# Patient Record
Sex: Female | Born: 1940 | Race: White | Hispanic: No | Smoking: Current every day smoker
Health system: Southern US, Community
[De-identification: ages and names within clinical notes are randomized; demographics above are authoritative.]

## PROBLEM LIST (undated history)

## (undated) DIAGNOSIS — M549 Dorsalgia, unspecified: Secondary | ICD-10-CM

## (undated) DIAGNOSIS — M542 Cervicalgia: Secondary | ICD-10-CM

## (undated) DIAGNOSIS — G8929 Other chronic pain: Secondary | ICD-10-CM

---

## 2017-03-03 ENCOUNTER — Emergency Department (HOSPITAL_BASED_OUTPATIENT_CLINIC_OR_DEPARTMENT_OTHER)
Admission: EM | Admit: 2017-03-03 | Discharge: 2017-03-03 | Disposition: A | Discharge status: Home / Self Care | Payer: Medicare Other | Attending: Emergency Medicine | Admitting: Emergency Medicine

## 2017-03-03 ENCOUNTER — Other Ambulatory Visit: Payer: Self-pay

## 2017-03-03 ENCOUNTER — Encounter (HOSPITAL_BASED_OUTPATIENT_CLINIC_OR_DEPARTMENT_OTHER): Payer: Self-pay | Admitting: Emergency Medicine

## 2017-03-03 DIAGNOSIS — F17200 Nicotine dependence, unspecified, uncomplicated: Secondary | ICD-10-CM | POA: Diagnosis not present

## 2017-03-03 DIAGNOSIS — M542 Cervicalgia: Secondary | ICD-10-CM | POA: Diagnosis present

## 2017-03-03 DIAGNOSIS — M5412 Radiculopathy, cervical region: Secondary | ICD-10-CM | POA: Diagnosis not present

## 2017-03-03 HISTORY — DX: Other chronic pain: G89.29

## 2017-03-03 HISTORY — DX: Dorsalgia, unspecified: M54.9

## 2017-03-03 HISTORY — DX: Cervicalgia: M54.2

## 2017-03-03 MED ORDER — ACETAMINOPHEN 500 MG PO TABS
1000.0000 mg | ORAL_TABLET | Freq: Once | ORAL | Status: AC
  Administered 2017-03-03: 1000 mg via ORAL
  Filled 2017-03-03: qty 2

## 2017-03-03 NOTE — ED Provider Notes (Signed)
[place] EMERGENCY DEPARTMENT Provider Note   CSN: [id number] Arrival date & time: 03/03/17  1431     History   Chief Complaint Chief Complaint  Patient presents with  . Neck Pain    HPI Alison Fisher is a 77 y.o. female.  77 year old female who presents with neck and shoulder pain.  The patient reports several weeks of neck and shoulder pain.  She was evaluated recently at another ER and had x-rays of her neck done.  She has been referred to a neurologist but her appointment is not until May.  She has an appointment with her PCP tomorrow.  She reports that her pain has been so severe recently that she has difficulty getting up out of bed in the mornings.  She has been taking aspirin with no relief.  She denies any recent falls.  She reports that the pain radiates down her shoulders and arms and is associated with some numbness in her right hand.  No symptoms in her legs.  No focal weakness.  No bowel/bladder problems.  No fevers.   The history is provided by the patient.  Neck Pain      Past Medical History:  Diagnosis Date  . Chronic neck and back pain     There are no active problems to display for this patient.   History reviewed. No pertinent surgical history.  OB History    No data available       Home Medications    Prior to Admission medications   Not on File    Family History History reviewed. No pertinent family history.  Social History Social History   Tobacco Use  . Smoking status: Current Every Day Smoker  . Smokeless tobacco: Never Used  Substance Use Topics  . Alcohol use: Yes    Alcohol/week: 10.2 oz    Types: 17 Shots of liquor per week    Frequency: Never    Comment: last drink 8 day ago - normally have 2 manhattans a day with [place] and [place]  . Drug use: No     Allergies   Penicillins and Prednisone   Review of Systems Review of Systems  Musculoskeletal: Positive for neck pain.   All other systems reviewed  and are negative except that which was mentioned in HPI   Physical Exam Updated Vital Signs BP (!) 182/96 (BP Location: Left Arm)   Pulse 80   Temp 98.2 F (36.8 C) (Oral)   Resp 18   Ht 4\' 10"  (1.473 m)   Wt 43.1 kg (95 lb)   SpO2 96%   BMI 19.86 kg/m   Physical Exam  Constitutional: She is oriented to person, place, and time. She appears well-developed and well-nourished. No distress.  Frail, elderly woman in NAD  HENT:  Head: Normocephalic and atraumatic.  Moist mucous membranes  Eyes: Conjunctivae are normal.  Neck: Neck supple.  Cardiovascular: Normal rate, regular rhythm and normal heart sounds.  No murmur heard. Pulmonary/Chest: Effort normal and breath sounds normal.  Abdominal: Soft. Bowel sounds are normal. She exhibits no distension. There is no tenderness.  Musculoskeletal: She exhibits no edema.  Kyphotic spine, no focal midline cervical or thoracic spine tenderness, generalized tenderness of bilateral trapezius muscles  Neurological: She is alert and oriented to person, place, and time. She displays normal reflexes. No sensory deficit.  Fluent speech 5/5 strength x all 4 ext Slow, steady gait  Skin: Skin is warm and dry.  Psychiatric: She has a normal mood  and affect.  Nursing note and vitals reviewed.    ED Treatments / Results  Labs (all labs ordered are listed, but only abnormal results are displayed) Labs Reviewed - No data to display  EKG  EKG Interpretation None       Radiology No results found.  Procedures Procedures (including critical care time)  Medications Ordered in ED Medications  acetaminophen (TYLENOL) tablet 1,000 mg (1,000 mg Oral Given 03/03/17 1720)     Initial Impression / Assessment and Plan / ED Course  I have reviewed the triage vital signs and the nursing notes.       Neurologically intact on exam.  Viewed plain films from other hospital which noted degenerative changes, no acute findings.  She describes  symptoms consistent with radiculopathy of the cervical spine.  She has no focal weakness today.  I have referred her to spine specialist as she may benefit from steroid injection and further evaluation with advanced imaging. Gave tylenol and discussed supportive measures. Final Clinical Impressions(s) / ED Diagnoses   Final diagnoses:  Cervical radiculopathy    ED Discharge Orders    None       Amalio Loe, Wenda Overland, MD 03/03/17 1726

## 2017-03-03 NOTE — ED Notes (Signed)
ED Provider at bedside. 

## 2017-03-03 NOTE — ED Notes (Signed)
amb to [facility] w/o assist

## 2017-03-03 NOTE — ED Notes (Signed)
[employer] voucher to provide pt a ride home

## 2017-03-03 NOTE — ED Triage Notes (Signed)
Per EMS:  Pt having neck pain for one month.  Pt seen by [facility] two weeks ago and referred to neurologist.  Pt states she is having a lot of pain and her appt is in May.  Has appt with PCP tomorrow.

## 2017-03-03 NOTE — ED Notes (Signed)
Pt demanding pain medication other than tylenol, MD instructed pt that al she was willing to given was tylenol

## 2017-03-15 ENCOUNTER — Emergency Department (HOSPITAL_COMMUNITY): Payer: Medicare Other

## 2017-03-15 ENCOUNTER — Inpatient Hospital Stay (HOSPITAL_COMMUNITY): Payer: Medicare Other

## 2017-03-15 ENCOUNTER — Inpatient Hospital Stay (HOSPITAL_COMMUNITY)
Admission: EM | Admit: 2017-03-15 | Discharge: 2017-04-06 | DRG: 471 | Disposition: E | Payer: Medicare Other | Attending: Pulmonary Disease | Admitting: Pulmonary Disease

## 2017-03-15 DIAGNOSIS — J439 Emphysema, unspecified: Secondary | ICD-10-CM | POA: Diagnosis present

## 2017-03-15 DIAGNOSIS — J690 Pneumonitis due to inhalation of food and vomit: Secondary | ICD-10-CM | POA: Diagnosis not present

## 2017-03-15 DIAGNOSIS — G92 Toxic encephalopathy: Secondary | ICD-10-CM | POA: Diagnosis present

## 2017-03-15 DIAGNOSIS — I9581 Postprocedural hypotension: Secondary | ICD-10-CM | POA: Diagnosis not present

## 2017-03-15 DIAGNOSIS — R627 Adult failure to thrive: Secondary | ICD-10-CM | POA: Diagnosis present

## 2017-03-15 DIAGNOSIS — Z880 Allergy status to penicillin: Secondary | ICD-10-CM

## 2017-03-15 DIAGNOSIS — Z7982 Long term (current) use of aspirin: Secondary | ICD-10-CM

## 2017-03-15 DIAGNOSIS — F17200 Nicotine dependence, unspecified, uncomplicated: Secondary | ICD-10-CM | POA: Diagnosis not present

## 2017-03-15 DIAGNOSIS — T424X5A Adverse effect of benzodiazepines, initial encounter: Secondary | ICD-10-CM | POA: Diagnosis present

## 2017-03-15 DIAGNOSIS — Z888 Allergy status to other drugs, medicaments and biological substances status: Secondary | ICD-10-CM

## 2017-03-15 DIAGNOSIS — Y92009 Unspecified place in unspecified non-institutional (private) residence as the place of occurrence of the external cause: Secondary | ICD-10-CM | POA: Diagnosis not present

## 2017-03-15 DIAGNOSIS — Z419 Encounter for procedure for purposes other than remedying health state, unspecified: Secondary | ICD-10-CM

## 2017-03-15 DIAGNOSIS — Z66 Do not resuscitate: Secondary | ICD-10-CM | POA: Diagnosis not present

## 2017-03-15 DIAGNOSIS — D72829 Elevated white blood cell count, unspecified: Secondary | ICD-10-CM | POA: Diagnosis not present

## 2017-03-15 DIAGNOSIS — R1313 Dysphagia, pharyngeal phase: Secondary | ICD-10-CM | POA: Diagnosis not present

## 2017-03-15 DIAGNOSIS — D649 Anemia, unspecified: Secondary | ICD-10-CM | POA: Diagnosis present

## 2017-03-15 DIAGNOSIS — J9601 Acute respiratory failure with hypoxia: Secondary | ICD-10-CM

## 2017-03-15 DIAGNOSIS — G8250 Quadriplegia, unspecified: Secondary | ICD-10-CM | POA: Diagnosis present

## 2017-03-15 DIAGNOSIS — Z515 Encounter for palliative care: Secondary | ICD-10-CM | POA: Diagnosis present

## 2017-03-15 DIAGNOSIS — C8330 Diffuse large B-cell lymphoma, unspecified site: Secondary | ICD-10-CM | POA: Diagnosis present

## 2017-03-15 DIAGNOSIS — J9690 Respiratory failure, unspecified, unspecified whether with hypoxia or hypercapnia: Secondary | ICD-10-CM

## 2017-03-15 DIAGNOSIS — G8929 Other chronic pain: Secondary | ICD-10-CM | POA: Diagnosis present

## 2017-03-15 DIAGNOSIS — G9340 Encephalopathy, unspecified: Secondary | ICD-10-CM | POA: Diagnosis present

## 2017-03-15 DIAGNOSIS — M4802 Spinal stenosis, cervical region: Secondary | ICD-10-CM | POA: Diagnosis present

## 2017-03-15 DIAGNOSIS — L8990 Pressure ulcer of unspecified site, unspecified stage: Secondary | ICD-10-CM

## 2017-03-15 DIAGNOSIS — Z4659 Encounter for fitting and adjustment of other gastrointestinal appliance and device: Secondary | ICD-10-CM

## 2017-03-15 DIAGNOSIS — R221 Localized swelling, mass and lump, neck: Secondary | ICD-10-CM | POA: Diagnosis not present

## 2017-03-15 DIAGNOSIS — M4852XA Collapsed vertebra, not elsewhere classified, cervical region, initial encounter for fracture: Secondary | ICD-10-CM | POA: Diagnosis not present

## 2017-03-15 DIAGNOSIS — R296 Repeated falls: Secondary | ICD-10-CM | POA: Diagnosis present

## 2017-03-15 DIAGNOSIS — Z720 Tobacco use: Secondary | ICD-10-CM | POA: Diagnosis not present

## 2017-03-15 DIAGNOSIS — W08XXXA Fall from other furniture, initial encounter: Secondary | ICD-10-CM | POA: Diagnosis not present

## 2017-03-15 DIAGNOSIS — G9520 Unspecified cord compression: Secondary | ICD-10-CM | POA: Diagnosis present

## 2017-03-15 LAB — COMPREHENSIVE METABOLIC PANEL
ALBUMIN: 3.7 g/dL (ref 3.5–5.0)
ALK PHOS: 69 U/L (ref 38–126)
ALT: 30 U/L (ref 14–54)
ANION GAP: 12 (ref 5–15)
AST: 30 U/L (ref 15–41)
BILIRUBIN TOTAL: 1.3 mg/dL — AB (ref 0.3–1.2)
BUN: 16 mg/dL (ref 6–20)
CO2: 30 mmol/L (ref 22–32)
Calcium: 9.1 mg/dL (ref 8.9–10.3)
Chloride: 92 mmol/L — ABNORMAL LOW (ref 101–111)
Creatinine, Ser: 0.43 mg/dL — ABNORMAL LOW (ref 0.44–1.00)
GFR calc Af Amer: >60 mL/min (ref >60)
GFR calc non Af Amer: >60 mL/min (ref >60)
GLUCOSE: 91 mg/dL (ref 65–99)
POTASSIUM: 3.9 mmol/L (ref 3.5–5.1)
SODIUM: 134 mmol/L — AB (ref 135–145)
TOTAL PROTEIN: 6.8 g/dL (ref 6.5–8.1)

## 2017-03-15 LAB — URINALYSIS, ROUTINE W REFLEX MICROSCOPIC
Bilirubin Urine: NEGATIVE
GLUCOSE, UA: NEGATIVE mg/dL
Hgb urine dipstick: NEGATIVE
Ketones, ur: 5 mg/dL — AB
LEUKOCYTES UA: NEGATIVE
Nitrite: NEGATIVE
PH: 7 (ref 5.0–8.0)
PROTEIN: NEGATIVE mg/dL
SPECIFIC GRAVITY, URINE: 1.013 (ref 1.005–1.030)

## 2017-03-15 LAB — I-STAT ARTERIAL BLOOD GAS, ED
Acid-Base Excess: 2 mmol/L (ref 0.0–2.0)
BICARBONATE: 27.9 mmol/L (ref 20.0–28.0)
O2 SAT: 98 %
PCO2 ART: 46.2 mmHg (ref 32.0–48.0)
PH ART: 7.387 (ref 7.350–7.450)
PO2 ART: 113 mmHg — AB (ref 83.0–108.0)
Patient temperature: 97.7
TCO2: 29 mmol/L (ref 22–32)

## 2017-03-15 LAB — I-STAT CHEM 8, ED
BUN: 19 mg/dL (ref 6–20)
CHLORIDE: 92 mmol/L — AB (ref 101–111)
CREATININE: 0.5 mg/dL (ref 0.44–1.00)
Calcium, Ion: 1.05 mmol/L — ABNORMAL LOW (ref 1.15–1.40)
Glucose, Bld: 90 mg/dL (ref 65–99)
HCT: 51 % — ABNORMAL HIGH (ref 36.0–46.0)
Hemoglobin: 17.3 g/dL — ABNORMAL HIGH (ref 12.0–15.0)
POTASSIUM: 4 mmol/L (ref 3.5–5.1)
Sodium: 134 mmol/L — ABNORMAL LOW (ref 135–145)
TCO2: 36 mmol/L — ABNORMAL HIGH (ref 22–32)

## 2017-03-15 LAB — CBC WITH DIFFERENTIAL/PLATELET
Basophils Absolute: 0 10*3/uL (ref 0.0–0.1)
Basophils Relative: 0 %
Eosinophils Absolute: 0.1 10*3/uL (ref 0.0–0.7)
Eosinophils Relative: 1 %
HEMATOCRIT: 48.4 % — AB (ref 36.0–46.0)
HEMOGLOBIN: 16.4 g/dL — AB (ref 12.0–15.0)
LYMPHS ABS: 1.1 10*3/uL (ref 0.7–4.0)
Lymphocytes Relative: 7 %
MCH: 34 pg (ref 26.0–34.0)
MCHC: 33.9 g/dL (ref 30.0–36.0)
MCV: 100.4 fL — AB (ref 78.0–100.0)
MONO ABS: 1.4 10*3/uL — AB (ref 0.1–1.0)
MONOS PCT: 9 %
NEUTROS PCT: 83 %
Neutro Abs: 13.1 10*3/uL — ABNORMAL HIGH (ref 1.7–7.7)
Platelets: 315 10*3/uL (ref 150–400)
RBC: 4.82 MIL/uL (ref 3.87–5.11)
RDW: 12.6 % (ref 11.5–15.5)
WBC: 15.7 10*3/uL — ABNORMAL HIGH (ref 4.0–10.5)

## 2017-03-15 LAB — PROTIME-INR
INR: 0.89
Prothrombin Time: 12 seconds (ref 11.4–15.2)

## 2017-03-15 LAB — TROPONIN I: Troponin I: <0.03 ng/mL (ref <0.03)

## 2017-03-15 LAB — CK: CK TOTAL: 222 U/L (ref 38–234)

## 2017-03-15 MED ORDER — SODIUM CHLORIDE 0.9 % IV BOLUS (SEPSIS)
1000.0000 mL | Freq: Once | INTRAVENOUS | Status: AC
  Administered 2017-03-15: 1000 mL via INTRAVENOUS

## 2017-03-15 MED ORDER — SODIUM CHLORIDE 0.9 % IV SOLN
INTRAVENOUS | Status: DC
  Administered 2017-03-15 – 2017-03-19 (×4): via INTRAVENOUS

## 2017-03-15 MED ORDER — DEXAMETHASONE SODIUM PHOSPHATE 10 MG/ML IJ SOLN
10.0000 mg | Freq: Once | INTRAMUSCULAR | Status: AC
  Administered 2017-03-15: 10 mg via INTRAVENOUS
  Filled 2017-03-15: qty 1

## 2017-03-15 MED ORDER — GADOBENATE DIMEGLUMINE 529 MG/ML IV SOLN
10.0000 mL | Freq: Once | INTRAVENOUS | Status: AC | PRN
  Administered 2017-03-15: 9 mL via INTRAVENOUS

## 2017-03-15 MED ORDER — ACETAMINOPHEN 325 MG PO TABS: 650.0000 mg | ORAL_TABLET | Freq: Four times a day (QID) | ORAL | Status: DC | PRN

## 2017-03-15 MED ORDER — ONDANSETRON HCL 4 MG PO TABS: 4.0000 mg | ORAL_TABLET | Freq: Four times a day (QID) | ORAL | Status: DC | PRN

## 2017-03-15 MED ORDER — ACETAMINOPHEN 650 MG RE SUPP: 650.0000 mg | Freq: Four times a day (QID) | RECTAL | Status: DC | PRN

## 2017-03-15 MED ORDER — ONDANSETRON HCL 4 MG/2ML IJ SOLN: 4.0000 mg | Freq: Four times a day (QID) | INTRAMUSCULAR | Status: DC | PRN

## 2017-03-15 MED ORDER — LORAZEPAM 2 MG/ML IJ SOLN
0.5000 mg | Freq: Once | INTRAMUSCULAR | Status: AC
  Administered 2017-03-15: 0.5 mg via INTRAVENOUS
  Filled 2017-03-15: qty 1

## 2017-03-15 MED ORDER — DEXAMETHASONE SODIUM PHOSPHATE 4 MG/ML IJ SOLN
4.0000 mg | Freq: Four times a day (QID) | INTRAMUSCULAR | Status: DC
  Administered 2017-03-16 – 2017-03-22 (×27): 4 mg via INTRAVENOUS
  Filled 2017-03-15 (×29): qty 1

## 2017-03-15 MED ORDER — IOPAMIDOL (ISOVUE-300) INJECTION 61%
INTRAVENOUS | Status: AC
  Administered 2017-03-15: 100 mL
  Filled 2017-03-15: qty 100

## 2017-03-15 NOTE — Consult Note (Addendum)
Chief Complaint   Chief Complaint  Patient presents with  . Weakness  . Fall    HPI   HPI: Alison Fisher is a 77 y.o. female who was brought to ER via EMS due to being unable to get up off floor after slipping from couch. When EMS arrived, she was unable to move any extremities voluntarily. She was given Ativan for MRI and therefore is still drowsy. She is unable to provide any history. Neighbor at bedside reports she has noticed a slow steady decline in mobility over the last several days with today being the most severe with the patient being unable to move any extremities. She also reports a slow decline in overall health status over the last several weeks as well. Patient has a sister in [place], but otherwise, no family. Neighbor is her closest friend in the area.  There are no active problems to display for this patient.   PMH: Past Medical History:  Diagnosis Date  . Chronic neck and back pain     PSH: No past surgical history on file.   (Not in a hospital admission)  SH: Social History   Tobacco Use  . Smoking status: Current Every Day Smoker  . Smokeless tobacco: Never Used  Substance Use Topics  . Alcohol use: Yes    Alcohol/week: 10.2 oz    Types: 17 Shots of liquor per week    Frequency: Never    Comment: last drink 8 day ago - normally have 2 manhattans a day with [place] and [place]  . Drug use: No   MEDS: Prior to Admission medications   Not on File    ALLERGY: Allergies  Allergen Reactions  . Penicillins Hives  . Prednisone Swelling    Social History   Tobacco Use  . Smoking status: Current Every Day Smoker  . Smokeless tobacco: Never Used  Substance Use Topics  . Alcohol use: Yes    Alcohol/week: 10.2 oz    Types: 17 Shots of liquor per week    Frequency: Never    Comment: last drink 8 day ago - normally have 2 manhattans a day with [place] and [place]     No family history on file.   ROS   ROS unable to obtain secondary to mental  status   Exam   Vitals:   03/09/2017 1545 03/22/2017 1900  BP: 133/63 130/64  Pulse: (!) 59 73  Resp: 19 17  Temp: 97.7 F (36.5 C)   SpO2: 94% 92%   Elderly female resting comfortably in bed Drowsy from recent ativan Does answer questions with yes and no Does not follow commands Does not voluntarily move extremities Minimal flexion of BLE to painful stimulus + Babinski b/l + Hoffmans, L>R Unable to check gait CN grossly normal   Results - Imaging/Labs   Results for orders placed or performed during the hospital encounter of 03/22/2017 (from the past 48 hour(s))  Comprehensive metabolic panel     Status: Abnormal   Collection Time: 03/25/2017  4:30 PM  Result Value Ref Range   Sodium 134 (L) 135 - 145 mmol/L   Potassium 3.9 3.5 - 5.1 mmol/L   Chloride 92 (L) 101 - 111 mmol/L   CO2 30 22 - 32 mmol/L   Glucose, Bld 91 65 - 99 mg/dL   BUN 16 6 - 20 mg/dL   Creatinine, Ser 0.43 (L) 0.44 - 1.00 mg/dL   Calcium 9.1 8.9 - 10.3 mg/dL   Total Protein 6.8 6.5 -  8.1 g/dL   Albumin 3.7 3.5 - 5.0 g/dL   AST 30 15 - 41 U/L   ALT 30 14 - 54 U/L   Alkaline Phosphatase 69 38 - 126 U/L   Total Bilirubin 1.3 (H) 0.3 - 1.2 mg/dL   GFR calc non Af Amer >60 >60 mL/min   GFR calc Af Amer >60 >60 mL/min    Comment: (NOTE) The eGFR has been calculated using the CKD EPI equation. This calculation has not been validated in all clinical situations. eGFR's persistently <60 mL/min signify possible Chronic Kidney Disease.    Anion gap 12 5 - 15    Comment: Performed at [facility], [place] [address], [place], [place] [address]  Troponin I     Status: None   Collection Time: 03/28/2017  4:30 PM  Result Value Ref Range   Troponin I <0.03 <0.03 ng/mL    Comment: Performed at [facility], [place] [address], [place], [place] [address]  CBC with Differential     Status: Abnormal   Collection Time: 03/27/2017  4:30 PM  Result Value Ref Range   WBC 15.7 (H) 4.0 - 10.5  K/uL   RBC 4.82 3.87 - 5.11 MIL/uL   Hemoglobin 16.4 (H) 12.0 - 15.0 g/dL   HCT 48.4 (H) 36.0 - 46.0 %   MCV 100.4 (H) 78.0 - 100.0 fL   MCH 34.0 26.0 - 34.0 pg   MCHC 33.9 30.0 - 36.0 g/dL   RDW 12.6 11.5 - 15.5 %   Platelets 315 150 - 400 K/uL   Neutrophils Relative % 83 %   Neutro Abs 13.1 (H) 1.7 - 7.7 K/uL   Lymphocytes Relative 7 %   Lymphs Abs 1.1 0.7 - 4.0 K/uL   Monocytes Relative 9 %   Monocytes Absolute 1.4 (H) 0.1 - 1.0 K/uL   Eosinophils Relative 1 %   Eosinophils Absolute 0.1 0.0 - 0.7 K/uL   Basophils Relative 0 %   Basophils Absolute 0.0 0.0 - 0.1 K/uL    Comment: Performed at [facility], [place] [address], [place], [place] [address]  Protime-INR     Status: None   Collection Time: 04/01/2017  4:30 PM  Result Value Ref Range   Prothrombin Time 12.0 11.4 - 15.2 seconds   INR 0.89     Comment: Performed at [facility], [place] [address], [place], [place] [address]  CK     Status: None   Collection Time: 03/12/2017  4:30 PM  Result Value Ref Range   Total CK 222 38 - 234 U/L    Comment: Performed at [facility], [place] [address], [place], [place] [address]  I-stat Chem 8, ED     Status: Abnormal   Collection Time: 03/26/2017  4:35 PM  Result Value Ref Range   Sodium 134 (L) 135 - 145 mmol/L   Potassium 4.0 3.5 - 5.1 mmol/L   Chloride 92 (L) 101 - 111 mmol/L   BUN 19 6 - 20 mg/dL   Creatinine, Ser 0.50 0.44 - 1.00 mg/dL   Glucose, Bld 90 65 - 99 mg/dL   Calcium, Ion 1.05 (L) 1.15 - 1.40 mmol/L   TCO2 36 (H) 22 - 32 mmol/L   Hemoglobin 17.3 (H) 12.0 - 15.0 g/dL   HCT 51.0 (H) 36.0 - 46.0 %  Urinalysis, Routine w reflex microscopic     Status: Abnormal   Collection Time: 03/14/2017  5:13 PM  Result Value  Ref Range   Color, Urine YELLOW YELLOW   APPearance HAZY (A) CLEAR   Specific Gravity, Urine 1.013 1.005 - 1.030   pH 7.0 5.0 - 8.0   Glucose, UA NEGATIVE NEGATIVE mg/dL   Hgb urine dipstick NEGATIVE NEGATIVE    Bilirubin Urine NEGATIVE NEGATIVE   Ketones, ur 5 (A) NEGATIVE mg/dL   Protein, ur NEGATIVE NEGATIVE mg/dL   Nitrite NEGATIVE NEGATIVE   Leukocytes, UA NEGATIVE NEGATIVE    Comment: Performed at [place] [address] [place], [place] [address]    Mr Cervical Spine W Wo Contrast  Result Date: 03/27/2017 CLINICAL DATA:  Generalized weakness. EXAM: MRI CERVICAL SPINE WITHOUT AND WITH CONTRAST TECHNIQUE: Multiplanar and multiecho pulse sequences of the cervical spine, to include the craniocervical junction and cervicothoracic junction, were obtained without and with intravenous contrast. CONTRAST:  29m MULTIHANCE GADOBENATE DIMEGLUMINE 529 MG/ML IV SOLN COMPARISON:  None. FINDINGS: Alignment: No listhesis. Vertebrae: Infiltrative enhancing mass throughout the C5 and C6 bodies, also invading the anterior C4 body. The mass is primarily enhancing, but there is a large ventral cystic component bulging into the prevertebral space where there is also ill-defined fluid signal. This cystic component measures up to 4.2 cm craniocaudal. The C5 body is partially nonenhancing and has collapsed with ventral epidural mass and bone retropulsion impinging on the cord which is mildly edematous. Both C4-5 and C5-6 foramina are likely infiltrated by mass. This has the appearance of malignancy rather than infection. Cord: Cord compression at C5, with T2 hyperintensity. Posterior Fossa, vertebral arteries, paraspinal tissues: Prevertebral mass as above. There is paraspinal edema symmetrically in the midcervical region, presumably related to patient's recent fall. Disc levels: C2-3: Facet spurring and ligamentum flavum buckling.  No impingement C3-4: Facet spurring and small central disc protrusion. Patent canal and foramina. C4-5: Cord compression by mass and C5 pathologic fracture retropulsion. C5-6: Cord compression by mass and C5 pathologic fracture retropulsion. C6-7: Unremarkable for age.  No  impingement. C7-T1:Unremarkable for age.  No impingement. Dr. GRegenia Skeeteris already aware of the findings at time of dictation. IMPRESSION: 1. 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 Large cystic/necrotic component which bulges into the prevertebral space. 2. Paravertebral cervical strain. CT would be complementary in excluding a posterior element fracture. Electronically Signed   By: JMonte FantasiaM.D.   On: 03/08/2017 18:59    Impression/Plan   77y.o. 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 There is T2 signal abnormality. She is quadriparetic. I have reviewed the case with attending, Dr NConsuella Losewho has also reviewed the imaging.  - No history of CA. Will need metastatic work up  - Because of significant cord compression, she will need to undergo decompressive cervical surgery. Likely cervical corpectomy with anterior plating and posterior fusion. Overall, slow decline in weakness >72 hours old. Will proceed tomorrow.  - Decadron 172monce and then 52m42m 6 hours. - CT C spine ordered for bone quality.

## 2017-03-15 NOTE — ED Provider Notes (Signed)
  Physical Exam  BP 130/64   Pulse 73   Temp 97.7 F (36.5 C) (Oral)   Resp 17   SpO2 92%   Physical Exam  ED Course/Procedures   Clinical Course as of Mar 15 2009  Sun Mar 15, 2017  1624 MRI is on-call for our facility and will currently be called in for a stat cervical MRI.  She would be given a dose of Ativan to help with anxiety for her claustrophobia.  [SG]    Clinical Course User Index [SG] Sherwood Gambler, MD    Procedures  MDM  Patient with cervical spine compression.  Sent from [place] to neurosurgery.  Discussed with neurosurgery and they are seeing the patient did not need further involvement from [place] in the ER.     Davonna Belling, MD 03/10/2017 2011

## 2017-03-15 NOTE — ED Notes (Signed)
This RN unable to remove garment from being too tight. Pt authorized this RN to cut pt top garment to remove it.

## 2017-03-15 NOTE — ED Notes (Signed)
Patient transported to MRI with MRI techs.

## 2017-03-15 NOTE — ED Triage Notes (Addendum)
Transported by [facility] from home--pt reports generalized weakness and multiple recent falls. Pt experienced a fall last night and friend found her today in the floor, friend called EMS. Pt reports neck pain + weakness. A & O x 4. VS: 100/60, 60 HR, 16 RR, CBG 106 mg/dl.

## 2017-03-15 NOTE — Progress Notes (Signed)
Received call fom EDP, Dr Regenia Skeeter regarding patient Presented to ER due to being unable to get up off floor after slipping out of couch. She is by report quadriparetic. MRI C spine is significant for infiltrative mass in C4 C5 and C6 vertebra, C5 pathological fracture with retropulsion causing spinal stenosis. There is cord compression and T2 signal abnormality. She has no known primary cancer. Rec hospitalist admission for work up. Patient to be transported ED to ED for stat NSY consultation

## 2017-03-15 NOTE — ED Notes (Signed)
Pt O2 found to be 92% on room air. Pt placed on 2L [place]. Pt O2 now 98%. Will continue to monitor.

## 2017-03-15 NOTE — ED Notes (Signed)
Patient transported to CT 

## 2017-03-15 NOTE — H&P (Signed)
History and Physical    Alison Fisher NWG:[id number] DOB: 06-25-1940 DOA: 03/29/2017  PCP: Patient, No Pcp Per  Patient coming from: Home  I have personally briefly reviewed patient's old medical records in [place]  Chief Complaint: Weakness, fall  HPI: Alison Fisher is a 77 y.o. female with medical history significant of chronic neck and back pain.  Patient was brought to ER via EMS due to being unable to get up off floor after slipping from couch.  When EMS arrived, she was unable to move any extremities voluntarily.  From other provider notes it seems as though she has had progressively worsening BUE and BLE weakness over the past few days.  Now complete plegia after fall off couch.  She was given Ativan for MRI and therefore is still drowsy. She is unable to provide any history to me right now.   ED Course: MRI reveals pathologic fracture of C spine, c spine mass, cord impingement.  Neurosurgery has already seen patient, see their note for details.   Review of Systems: Unable to perform due to patient AMS.  Past Medical History:  Diagnosis Date  . Chronic neck and back pain     No past surgical history on file.   reports that she has been smoking.  she has never used smokeless tobacco. She reports that she drinks about 10.2 oz of alcohol per week. She reports that she does not use drugs.  Allergies  Allergen Reactions  . Penicillins Hives    No other information available 03/29/2017 Has patient had a PCN reaction causing immediate rash, facial/tongue/throat swelling, SOB or lightheadedness with hypotension: Unknown Has patient had a PCN reaction causing severe rash involving mucus membranes or skin necrosis: Unknown Has patient had a PCN reaction that required hospitalization: Unknown Has patient had a PCN reaction occurring within the last 10 years: Unknown If all of the above answers are "NO", then may proceed with Cephalosporin use.  . Prednisone Swelling    No  family history on file. Unable to obtain due to AMS  Prior to Admission medications   Medication Sig Start Date End Date Taking? Authorizing Provider  aspirin EC 81 MG tablet Take 81 mg by mouth daily.    [provider]  methocarbamol (ROBAXIN) 500 MG tablet Take 500 mg by mouth 4 (four) times daily. 10 day course filled 03/04/17 03/04/17   [provider]  methylPREDNISolone (MEDROL DOSEPAK) 4 MG TBPK tablet Take 4 mg by mouth. 6 day dose pak filled 03/04/17 03/04/17   [provider]  predniSONE (DELTASONE) 10 MG tablet Take 20 mg by mouth daily. 7 day course filled 03/10/17 03/10/17   [provider]  traMADol (ULTRAM) 50 MG tablet Take 50 mg by mouth every 8 (eight) hours as needed (pain).  03/09/17   [provider]    Physical Exam: Vitals:   03/06/2017 1545 03/09/2017 1900 03/14/2017 2122  BP: 133/63 130/64 139/60  Pulse: (!) 59 73 69  Resp: 19 17 13   Temp: 97.7 F (36.5 C)    TempSrc: Oral    SpO2: 94% 92% 99%    Constitutional: Resting in bed, difficult to arrouse but will open eyes and answer a few questions in soft voice. Eyes: PERRL, lids and conjunctivae normal ENMT: Mucous membranes are moist. Posterior pharynx clear of any exudate or lesions.Normal dentition.  Neck: normal, no masses, no thyromegaly movement not tested due to fx Respiratory: clear to auscultation bilaterally, no wheezing, no crackles. Normal respiratory  effort. No accessory muscle use.  Cardiovascular: Regular rate and rhythm, no murmurs / rubs / gallops. No extremity edema. 2+ pedal pulses. No carotid bruits.  Abdomen: no tenderness, no masses palpated. No hepatosplenomegaly. Bowel sounds positive.  Musculoskeletal: no clubbing / cyanosis. No joint deformity upper and lower extremities. Good ROM, no contractures. Normal muscle tone.  Skin: no rashes, lesions, ulcers. No induration Neurologic: Slowly answering few questions yes and no.  +babinski bilaterally, + hoffman's  L> R, quadriplegic Psychiatric: Sedated.   Labs on Admission: I have personally reviewed following labs and imaging studies  CBC: Recent Labs  Lab 03/21/2017 1630 04/01/2017 1635  WBC 15.7*  --   NEUTROABS 13.1*  --   HGB 16.4* 17.3*  HCT 48.4* 51.0*  MCV 100.4*  --   PLT 315  --    Basic Metabolic Panel: Recent Labs  Lab 03/30/2017 1630 03/12/2017 1635  NA 134* 134*  K 3.9 4.0  CL 92* 92*  CO2 30  --   GLUCOSE 91 90  BUN 16 19  CREATININE 0.43* 0.50  CALCIUM 9.1  --    GFR: Estimated Creatinine Clearance: 37.4 mL/min (by C-G formula based on SCr of 0.5 mg/dL). Liver Function Tests: Recent Labs  Lab 03/10/2017 1630  AST 30  ALT 30  ALKPHOS 69  BILITOT 1.3*  PROT 6.8  ALBUMIN 3.7   No results for input(s): LIPASE, AMYLASE in the last 168 hours. No results for input(s): AMMONIA in the last 168 hours. Coagulation Profile: Recent Labs  Lab 03/07/2017 1630  INR 0.89   Cardiac Enzymes: Recent Labs  Lab 04/05/2017 1630  CKTOTAL 222  TROPONINI <0.03   BNP (last 3 results) No results for input(s): PROBNP in the last 8760 hours. HbA1C: No results for input(s): HGBA1C in the last 72 hours. CBG: No results for input(s): GLUCAP in the last 168 hours. Lipid Profile: No results for input(s): CHOL, HDL, LDLCALC, TRIG, CHOLHDL, LDLDIRECT in the last 72 hours. Thyroid Function Tests: No results for input(s): TSH, T4TOTAL, FREET4, T3FREE, THYROIDAB in the last 72 hours. Anemia Panel: No results for input(s): VITAMINB12, FOLATE, FERRITIN, TIBC, IRON, RETICCTPCT in the last 72 hours. Urine analysis:    Component Value Date/Time   COLORURINE YELLOW 03/09/2017 1713   APPEARANCEUR HAZY (A) 03/12/2017 1713   LABSPEC 1.013 03/31/2017 1713   PHURINE 7.0 03/27/2017 1713   GLUCOSEU NEGATIVE 03/06/2017 1713   HGBUR NEGATIVE 04/05/2017 1713   BILIRUBINUR NEGATIVE 03/25/2017 1713   KETONESUR 5 (A) 03/09/2017 1713   PROTEINUR NEGATIVE 03/14/2017 1713   NITRITE NEGATIVE 03/14/2017  1713   LEUKOCYTESUR NEGATIVE 03/07/2017 1713    Radiological Exams on Admission: Ct Cervical Spine Wo Contrast  Result Date: 03/22/2017 CLINICAL DATA:  Cervical fracture. EXAM: CT CERVICAL SPINE WITHOUT CONTRAST TECHNIQUE: Multidetector CT imaging of the cervical spine was performed without intravenous contrast. Multiplanar CT image reconstructions were also generated. COMPARISON:  MRI of the cervical spine, 03/17/2017 FINDINGS: Alignment: Mild kyphosis centered at C5-C6.  No spondylolisthesis. Skull base and vertebrae: There is a lytic destructive lesion extending throughout most of the C5 and C6 vertebral bodies. There is marked loss of disc height at this level. Small lucencies are noted in the C7 vertebra. The lytic destructive lesion at C5-C6 has a soft tissue component that bulges into the prevertebral soft tissues elevating the overlying pharynx and larynx. The soft tissue mass including the bone lesion component, measures approximately 5.2 x 2.9 x 3.4 cm. There is moderate loss of height  of the C5 vertebra with mild loss of height of the C6 vertebra, along with the loss of disc height. Soft tissues and spinal canal: There is posterior bulging of the C5 vertebra encroaching upon the spinal canal, better evaluated on the current MRI. Disc levels: Moderate loss of disc height is noted at C6-C7, which appears degenerative. Upper chest: No other soft tissue masses. No discrete enlarged lymph nodes. Lung apices are clear. Other: None. IMPRESSION: 1. Pathologic fracture of C5 due to a large infiltrative mass that involves C5 and C6, extends along the prevertebral soft tissues and encroaches upon the ventral epidural space, better assessed on the current cervical MRI. This may reflect metastatic disease or a primary bone neoplasm such as lymphoma. Electronically Signed   By: Lajean Manes M.D.   On: 03/28/2017 21:33   Mr Cervical Spine W Wo Contrast  Result Date: 03/06/2017 CLINICAL DATA:  Generalized  weakness. EXAM: MRI CERVICAL SPINE WITHOUT AND WITH CONTRAST TECHNIQUE: Multiplanar and multiecho pulse sequences of the cervical spine, to include the craniocervical junction and cervicothoracic junction, were obtained without and with intravenous contrast. CONTRAST:  61mL MULTIHANCE GADOBENATE DIMEGLUMINE 529 MG/ML IV SOLN COMPARISON:  None. FINDINGS: Alignment: No listhesis. Vertebrae: Infiltrative enhancing mass throughout the C5 and C6 bodies, also invading the anterior C4 body. The mass is primarily enhancing, but there is a large ventral cystic component bulging into the prevertebral space where there is also ill-defined fluid signal. This cystic component measures up to 4.2 cm craniocaudal. The C5 body is partially nonenhancing and has collapsed with ventral epidural mass and bone retropulsion impinging on the cord which is mildly edematous. Both C4-5 and C5-6 foramina are likely infiltrated by mass. This has the appearance of malignancy rather than infection. Cord: Cord compression at C5, with T2 hyperintensity. Posterior Fossa, vertebral arteries, paraspinal tissues: Prevertebral mass as above. There is paraspinal edema symmetrically in the midcervical region, presumably related to patient's recent fall. Disc levels: C2-3: Facet spurring and ligamentum flavum buckling.  No impingement C3-4: Facet spurring and small central disc protrusion. Patent canal and foramina. C4-5: Cord compression by mass and C5 pathologic fracture retropulsion. C5-6: Cord compression by mass and C5 pathologic fracture retropulsion. C6-7: Unremarkable for age.  No impingement. C7-T1:Unremarkable for age.  No impingement. Dr. Regenia Skeeter is already aware of the findings at time of dictation. IMPRESSION: 1. 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 Ventral epidural mass and C5 pathologic fracture with retropulsion causes spinal stenosis with cord compression and T2 signal  abnormality. Large cystic/necrotic component which bulges into the prevertebral space. 2. Paravertebral cervical strain. CT would be complementary in excluding a posterior element fracture. Electronically Signed   By: Monte Fantasia M.D.   On: 03/18/2017 18:59    EKG: Independently reviewed.  Assessment/Plan Principal Problem:   Pathologic compression fracture of cervical vertebra, initial encounter [facility]) Active Problems:   Acute encephalopathy   Quadriplegia and quadriparesis (HCC)   Neck mass    1. Pathologic fx of cervical vertebras due to neck mass, with associated C spine compression and quadriplegia / quadriparesis - 1. See NS consult note 2. Surgery tomorrow 3. Per NS: Decadron IV - h/o prednisone "allergy" in computer, however, patient filled a methylprednisolone script 03/04/17 according to pharmacy records 4. Per NS: no need for C.Colar (I specifically asked), they dont feel this will help. 5. Per NS: no specific head of bed positioning required at this time (also specifically asked).  6. NPO for surgery 7. Obtaining CT of chest/abd/pelvis to go looking for other mets / primary tumor site. 2. Acute encephalopathy - 1. likely iatrogenic from the ativan she was given earlier 2. Observe and let ativan wear off 3. ABG obtained, looks good, no evidence of CO2 retention or indication for intubation at this time. 4. Will put on SDU for close monitoring overnight  DVT prophylaxis: SCDs only - planned OR in AM Code Status: Full Family Communication: No family in room Disposition Plan: TBD Consults called: Neurosurgery Admission status: Admit to inpatient - inpatient status for new quadriplegia, catastrophic C spine injury.   Etta Quill DO [facility] Pager [phone]  If 7AM-7PM, please contact day team taking care of patient [email or web address] Password [email or web address]  03/14/2017, 10:30 PM

## 2017-03-15 NOTE — ED Notes (Signed)
Bed: WA21 Expected date:  Expected time:  Means of arrival:  Comments: Weakness 

## 2017-03-15 NOTE — ED Notes (Addendum)
Pt here for neursurgery consult, mass on t4-t6. Pt had ativan prior to being transferred and is groggy per [facility].

## 2017-03-15 NOTE — ED Provider Notes (Signed)
[facility] DEPT Provider Note   CSN: [id number] Arrival date & time: 03/29/2017  1528     History   Chief Complaint Chief Complaint  Patient presents with  . Weakness  . Fall    HPI Alison Fisher is a 77 y.o. female.  HPI  77 year old female presents with weakness.  She is an overall poor historian.  She states she was on a couch last night and slipped off and was unable to get up.  She laid there until her neighbor came to check on her but her neighbor was also unable to get her up.  EMS had to be called in and was able to get her up and bring her here.  She states that all 4 extremities are weak.  She has noticed some arm and hand weakness and numbness but now it is progressive.  She is unable to tell me exactly how long is been going on.  All of her neck pain and weakness symptoms started in February.  However she was seen here February 26 and was able to move all 4 extremities.  She was put on steroids as well and states she has finished this.  She states sometime between then and now she developed a weakness that has been gradually worsening.  She states she has been having urinary incontinence on and off for about a month.  She denies any neck trauma and did not injure her neck on the fall last night.  Has had some headaches on and off but no significant headache.  Past Medical History:  Diagnosis Date  . Chronic neck and back pain     There are no active problems to display for this patient.   No past surgical history on file.  OB History    No data available       Home Medications    Prior to Admission medications   Not on File    Family History No family history on file.  Social History Social History   Tobacco Use  . Smoking status: Current Every Day Smoker  . Smokeless tobacco: Never Used  Substance Use Topics  . Alcohol use: Yes    Alcohol/week: 10.2 oz    Types: 17 Shots of liquor per week    Frequency: Never   Comment: last drink 8 day ago - normally have 2 manhattans a day with [place] and [place]  . Drug use: No     Allergies   Penicillins and Prednisone   Review of Systems Review of Systems  Constitutional: Negative for fever.  Cardiovascular: Negative for chest pain.  Gastrointestinal: Negative for abdominal pain.  Musculoskeletal: Positive for neck pain.  Neurological: Positive for weakness and numbness.     Physical Exam Updated Vital Signs BP 130/64   Pulse 73   Temp 97.7 F (36.5 C) (Oral)   Resp 17   SpO2 92%   Physical Exam  Constitutional: She is oriented to person, place, and time. She appears well-developed and well-nourished.  HENT:  Head: Normocephalic and atraumatic.  Right Ear: External ear normal.  Left Ear: External ear normal.  Nose: Nose normal.  Mouth/Throat: Mucous membranes are dry.  Eyes: EOM are normal. Pupils are equal, round, and reactive to light. Right eye exhibits no discharge. Left eye exhibits no discharge.  Neck: No spinous process tenderness present.  Cardiovascular: Normal rate, regular rhythm, normal heart sounds and intact distal pulses.  Pulmonary/Chest: Effort normal and breath sounds normal.  Abdominal: Soft.  There is no tenderness.  Musculoskeletal:       Cervical back: She exhibits tenderness.       Back:  Neurological: She is alert and oriented to person, place, and time. She displays Babinski's sign on the right side. She displays Babinski's sign on the left side.  Reflex Scores:      Patellar reflexes are 1+ on the right side and 0 on the left side.      Achilles reflexes are 1+ on the right side and 1+ on the left side. Unable to move all 4 extremities. CN 3-12 grossly intact. Normal speech. Normal sensation RUE. Decreased sensation from left forearm to hand. Decreased sensation in lower legs/feet  Skin: Skin is warm and dry.  Nursing note and vitals reviewed.    ED Treatments / Results  Labs (all labs ordered are  listed, but only abnormal results are displayed) Labs Reviewed  COMPREHENSIVE METABOLIC PANEL - Abnormal; Notable for the following components:      Result Value   Sodium 134 (*)    Chloride 92 (*)    Creatinine, Ser 0.43 (*)    Total Bilirubin 1.3 (*)    All other components within normal limits  CBC WITH DIFFERENTIAL/PLATELET - Abnormal; Notable for the following components:   WBC 15.7 (*)    Hemoglobin 16.4 (*)    HCT 48.4 (*)    MCV 100.4 (*)    Neutro Abs 13.1 (*)    Monocytes Absolute 1.4 (*)    All other components within normal limits  URINALYSIS, ROUTINE W REFLEX MICROSCOPIC - Abnormal; Notable for the following components:   APPearance HAZY (*)    Ketones, ur 5 (*)    All other components within normal limits  I-STAT CHEM 8, ED - Abnormal; Notable for the following components:   Sodium 134 (*)    Chloride 92 (*)    Calcium, Ion 1.05 (*)    TCO2 36 (*)    Hemoglobin 17.3 (*)    HCT 51.0 (*)    All other components within normal limits  TROPONIN I  PROTIME-INR  CK    EKG  EKG Interpretation  Date/Time:  Sunday March 15 2017 16:53:56 EDT Ventricular Rate:  61 PR Interval:    QRS Duration: 87 QT Interval:  441 QTC Calculation: 445 R Axis:   81 Text Interpretation:  Sinus rhythm Short PR interval Borderline right axis deviation Consider left ventricular hypertrophy Nonspecific T abnormalities, lateral leads Baseline wander in lead(s) V3 No old tracing to compare limited eval due to wander of V3 Confirmed by Sherwood Gambler [phone]) on 03/12/2017 5:37:16 PM       Radiology Mr Cervical Spine W Wo Contrast  Result Date: 03/27/2017 CLINICAL DATA:  Generalized weakness. EXAM: MRI CERVICAL SPINE WITHOUT AND WITH CONTRAST TECHNIQUE: Multiplanar and multiecho pulse sequences of the cervical spine, to include the craniocervical junction and cervicothoracic junction, were obtained without and with intravenous contrast. CONTRAST:  83mL MULTIHANCE GADOBENATE DIMEGLUMINE 529  MG/ML IV SOLN COMPARISON:  None. FINDINGS: Alignment: No listhesis. Vertebrae: Infiltrative enhancing mass throughout the C5 and C6 bodies, also invading the anterior C4 body. The mass is primarily enhancing, but there is a large ventral cystic component bulging into the prevertebral space where there is also ill-defined fluid signal. This cystic component measures up to 4.2 cm craniocaudal. The C5 body is partially nonenhancing and has collapsed with ventral epidural mass and bone retropulsion impinging on the cord which is mildly edematous. Both C4-5 and  C5-6 foramina are likely infiltrated by mass. This has the appearance of malignancy rather than infection. Cord: Cord compression at C5, with T2 hyperintensity. Posterior Fossa, vertebral arteries, paraspinal tissues: Prevertebral mass as above. There is paraspinal edema symmetrically in the midcervical region, presumably related to patient's recent fall. Disc levels: C2-3: Facet spurring and ligamentum flavum buckling.  No impingement C3-4: Facet spurring and small central disc protrusion. Patent canal and foramina. C4-5: Cord compression by mass and C5 pathologic fracture retropulsion. C5-6: Cord compression by mass and C5 pathologic fracture retropulsion. C6-7: Unremarkable for age.  No impingement. C7-T1:Unremarkable for age.  No impingement. Dr. Regenia Skeeter is already aware of the findings at time of dictation. IMPRESSION: 1. 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 Large cystic/necrotic component which bulges into the prevertebral space. 2. Paravertebral cervical strain. CT would be complementary in excluding a posterior element fracture. Electronically Signed   By: Monte Fantasia M.D.   On: 03/06/2017 18:59    Procedures .Critical Care Performed by:  Sherwood Gambler, MD Authorized by: Sherwood Gambler, MD   Critical care provider statement:    Critical care time (minutes):  40   Critical care time was exclusive of:  Separately billable procedures and treating other patients   Critical care was necessary to treat or prevent imminent or life-threatening deterioration of the following conditions:  CNS failure or compromise   Critical care was time spent personally by me on the following activities:  Development of treatment plan with patient or surrogate, discussions with consultants, evaluation of patient's response to treatment, examination of patient, obtaining history from patient or surrogate, ordering and performing treatments and interventions, ordering and review of laboratory studies, ordering and review of radiographic studies, pulse oximetry, re-evaluation of patient's condition and review of old charts   (including critical care time)  Medications Ordered in ED Medications  sodium chloride 0.9 % bolus 1,000 mL (1,000 mLs Intravenous New Bag/Given 04/03/2017 1857)  LORazepam (ATIVAN) injection 0.5 mg (0.5 mg Intravenous Given 03/25/2017 1803)  gadobenate dimeglumine (MULTIHANCE) injection 10 mL (9 mLs Intravenous Contrast Given 03/14/2017 1835)     Initial Impression / Assessment and Plan / ED Course  I have reviewed the triage vital signs and the nursing notes.  Pertinent labs & imaging results that were available during my care of the patient were reviewed by me and considered in my medical decision making (see chart for details).  Clinical Course as of Mar 16 1919  Sun Mar 15, 2017  1624 MRI is on-call for our facility and will currently be called in for a stat cervical MRI.  She would be given a dose of Ativan to help with anxiety for her claustrophobia.  [SG]    Clinical Course User Index [SG] Sherwood Gambler, MD    I discussed MRI results with [facility] of neurosurgery.  He has been discussing with Dr. Kathyrn Sheriff. The patient is  drowsy after the Ativan required to get her through MRI and I have discussed results with the patient but also her neighbor at the bedside after patient clearance.  Neurosurgery is asking for hospitalist to admit but that the patient be sent over ASAP to the [facility] emergency department for neurosurgery eval.  I asked about possible steroids and they state they will see her first and then likely start on steroids.  I discussed with EDP at [facility], Dr. Dayna Barker, who is aware of patient transfer and need for neurosurgery to see her. I called the hospitalist, Dr. Hal Hope, who advises for patient to be transferred emergently to the [facility] ED and he would not have enough time to see her.  He states that the hospitalist service should be reconsulted by the EDP at [facility] on arrival for them to admit.  Final Clinical Impressions(s) / ED Diagnoses   Final diagnoses:  Spinal cord compression [facility])    ED Discharge Orders    None       Sherwood Gambler, MD 03/09/2017 [phone]

## 2017-03-15 NOTE — ED Notes (Signed)
Neurosurgery consult at bedside

## 2017-03-15 NOTE — ED Notes (Signed)
Patient is stating she can not move her hands and has not been able too move them since sometime in February, yet patient just stated she can drank some water this morning.

## 2017-03-15 NOTE — ED Notes (Signed)
[facility] here for transport to [facility] ED.

## 2017-03-16 ENCOUNTER — Encounter (HOSPITAL_COMMUNITY): Admission: EM | Disposition: E | Payer: Self-pay | Source: Home / Self Care | Attending: Emergency Medicine

## 2017-03-16 ENCOUNTER — Inpatient Hospital Stay (HOSPITAL_COMMUNITY): Payer: Medicare Other

## 2017-03-16 ENCOUNTER — Inpatient Hospital Stay (HOSPITAL_COMMUNITY): Payer: Medicare Other | Admitting: Anesthesiology

## 2017-03-16 ENCOUNTER — Encounter (HOSPITAL_COMMUNITY): Payer: Self-pay | Admitting: Anesthesiology

## 2017-03-16 DIAGNOSIS — Z720 Tobacco use: Secondary | ICD-10-CM

## 2017-03-16 DIAGNOSIS — L8990 Pressure ulcer of unspecified site, unspecified stage: Secondary | ICD-10-CM

## 2017-03-16 HISTORY — PX: ANTERIOR CERVICAL CORPECTOMY: SHX1159

## 2017-03-16 LAB — CBG MONITORING, ED: GLUCOSE-CAPILLARY: 90 mg/dL (ref 65–99)

## 2017-03-16 LAB — SURGICAL PCR SCREEN
MRSA, PCR: NEGATIVE
Staphylococcus aureus: NEGATIVE

## 2017-03-16 SURGERY — ANTERIOR CERVICAL CORPECTOMY
Anesthesia: General | Site: Neck

## 2017-03-16 MED ORDER — ONDANSETRON HCL 4 MG/2ML IJ SOLN: 4.0000 mg | Freq: Once | INTRAMUSCULAR | Status: DC | PRN

## 2017-03-16 MED ORDER — BISACODYL 10 MG RE SUPP
10.0000 mg | Freq: Every day | RECTAL | Status: DC | PRN
  Administered 2017-03-22: 10 mg via RECTAL
  Filled 2017-03-16: qty 1

## 2017-03-16 MED ORDER — ONDANSETRON HCL 4 MG/2ML IJ SOLN: 4.0000 mg | Freq: Four times a day (QID) | INTRAMUSCULAR | Status: DC | PRN

## 2017-03-16 MED ORDER — THROMBIN 20000 UNITS EX SOLR
CUTANEOUS | Status: AC
  Filled 2017-03-16: qty 20000

## 2017-03-16 MED ORDER — METHOCARBAMOL 500 MG PO TABS: 500.0000 mg | ORAL_TABLET | Freq: Four times a day (QID) | ORAL | Status: DC | PRN

## 2017-03-16 MED ORDER — FENTANYL CITRATE (PF) 100 MCG/2ML IJ SOLN
INTRAMUSCULAR | Status: DC | PRN
  Administered 2017-03-16: 100 ug via INTRAVENOUS
  Administered 2017-03-16: 25 ug via INTRAVENOUS

## 2017-03-16 MED ORDER — LACTATED RINGERS IV SOLN
INTRAVENOUS | Status: DC | PRN
  Administered 2017-03-16 (×2): via INTRAVENOUS

## 2017-03-16 MED ORDER — SUCCINYLCHOLINE CHLORIDE 20 MG/ML IJ SOLN
INTRAMUSCULAR | Status: DC | PRN
  Administered 2017-03-16: 100 mg via INTRAVENOUS

## 2017-03-16 MED ORDER — SENNOSIDES-DOCUSATE SODIUM 8.6-50 MG PO TABS: 1.0000 | ORAL_TABLET | Freq: Every evening | ORAL | Status: DC | PRN

## 2017-03-16 MED ORDER — LIDOCAINE HCL (CARDIAC) 20 MG/ML IV SOLN
INTRAVENOUS | Status: DC | PRN
  Administered 2017-03-16: 40 mg via INTRAVENOUS

## 2017-03-16 MED ORDER — SODIUM CHLORIDE 0.9% FLUSH: 3.0000 mL | INTRAVENOUS | Status: DC | PRN

## 2017-03-16 MED ORDER — BUPIVACAINE HCL 0.5 % IJ SOLN
INTRAMUSCULAR | Status: DC | PRN
  Administered 2017-03-16: 3.5 mL

## 2017-03-16 MED ORDER — ALBUMIN HUMAN 5 % IV SOLN
INTRAVENOUS | Status: DC | PRN
  Administered 2017-03-16: 16:00:00 via INTRAVENOUS

## 2017-03-16 MED ORDER — SODIUM CHLORIDE 0.9 % IV SOLN: 250.0000 mL | INTRAVENOUS | Status: DC

## 2017-03-16 MED ORDER — ACETAMINOPHEN 650 MG RE SUPP: 650.0000 mg | RECTAL | Status: DC | PRN

## 2017-03-16 MED ORDER — LIDOCAINE-EPINEPHRINE 1 %-1:100000 IJ SOLN
INTRAMUSCULAR | Status: DC | PRN
  Administered 2017-03-16: 3.5 mL

## 2017-03-16 MED ORDER — DOCUSATE SODIUM 100 MG PO CAPS
100.0000 mg | ORAL_CAPSULE | Freq: Two times a day (BID) | ORAL | Status: DC
  Filled 2017-03-16: qty 1

## 2017-03-16 MED ORDER — THROMBIN 5000 UNITS EX SOLR
CUTANEOUS | Status: AC
Start: 2017-03-16 — End: 2017-03-16
  Filled 2017-03-16: qty 5000

## 2017-03-16 MED ORDER — FENTANYL CITRATE (PF) 250 MCG/5ML IJ SOLN
INTRAMUSCULAR | Status: AC
  Filled 2017-03-16: qty 5

## 2017-03-16 MED ORDER — SODIUM CHLORIDE 0.9% FLUSH: 3.0000 mL | Freq: Two times a day (BID) | INTRAVENOUS | Status: DC

## 2017-03-16 MED ORDER — LIDOCAINE-EPINEPHRINE 1 %-1:100000 IJ SOLN
INTRAMUSCULAR | Status: AC
  Filled 2017-03-16: qty 1

## 2017-03-16 MED ORDER — SODIUM CHLORIDE 0.9 % IV SOLN: INTRAVENOUS | Status: DC

## 2017-03-16 MED ORDER — ROCURONIUM BROMIDE 100 MG/10ML IV SOLN
INTRAVENOUS | Status: DC | PRN
  Administered 2017-03-16: 20 mg via INTRAVENOUS
  Administered 2017-03-16: 10 mg via INTRAVENOUS

## 2017-03-16 MED ORDER — VANCOMYCIN HCL IN DEXTROSE 750-5 MG/150ML-% IV SOLN
750.0000 mg | Freq: Once | INTRAVENOUS | Status: AC
  Administered 2017-03-17: 750 mg via INTRAVENOUS
  Filled 2017-03-16: qty 150

## 2017-03-16 MED ORDER — ACETAMINOPHEN 500 MG PO TABS: 1000.0000 mg | ORAL_TABLET | Freq: Four times a day (QID) | ORAL | Status: DC

## 2017-03-16 MED ORDER — PHENOL 1.4 % MT LIQD: 1.0000 | OROMUCOSAL | Status: DC | PRN

## 2017-03-16 MED ORDER — ONDANSETRON HCL 4 MG/2ML IJ SOLN
INTRAMUSCULAR | Status: DC | PRN
  Administered 2017-03-16: 4 mg via INTRAVENOUS

## 2017-03-16 MED ORDER — OXYCODONE HCL 5 MG PO TABS: 10.0000 mg | ORAL_TABLET | ORAL | Status: DC | PRN

## 2017-03-16 MED ORDER — VANCOMYCIN HCL IN DEXTROSE 1-5 GM/200ML-% IV SOLN
INTRAVENOUS | Status: AC
  Filled 2017-03-16: qty 200

## 2017-03-16 MED ORDER — GABAPENTIN 300 MG PO CAPS
300.0000 mg | ORAL_CAPSULE | Freq: Three times a day (TID) | ORAL | Status: DC
  Filled 2017-03-16: qty 1

## 2017-03-16 MED ORDER — SUGAMMADEX SODIUM 200 MG/2ML IV SOLN
INTRAVENOUS | Status: DC | PRN
  Administered 2017-03-16: 200 mg via INTRAVENOUS

## 2017-03-16 MED ORDER — SENNA 8.6 MG PO TABS
1.0000 | ORAL_TABLET | Freq: Two times a day (BID) | ORAL | Status: DC
  Filled 2017-03-16: qty 1

## 2017-03-16 MED ORDER — PHENYLEPHRINE HCL 10 MG/ML IJ SOLN
INTRAVENOUS | Status: DC | PRN
  Administered 2017-03-16: 50 ug/min via INTRAVENOUS

## 2017-03-16 MED ORDER — HYDROCODONE-ACETAMINOPHEN 5-325 MG PO TABS: 1.0000 | ORAL_TABLET | ORAL | Status: DC | PRN

## 2017-03-16 MED ORDER — VANCOMYCIN HCL 1000 MG IV SOLR
INTRAVENOUS | Status: DC | PRN
  Administered 2017-03-16: 1000 mg via INTRAVENOUS

## 2017-03-16 MED ORDER — ACETAMINOPHEN 325 MG PO TABS: 650.0000 mg | ORAL_TABLET | ORAL | Status: DC | PRN

## 2017-03-16 MED ORDER — PROPOFOL 10 MG/ML IV BOLUS
INTRAVENOUS | Status: AC
  Filled 2017-03-16: qty 20

## 2017-03-16 MED ORDER — THROMBIN (RECOMBINANT) 5000 UNITS EX SOLR
OROMUCOSAL | Status: DC | PRN
  Administered 2017-03-16: 15:00:00 via TOPICAL

## 2017-03-16 MED ORDER — BUPIVACAINE HCL (PF) 0.5 % IJ SOLN
INTRAMUSCULAR | Status: AC
  Filled 2017-03-16: qty 30

## 2017-03-16 MED ORDER — FENTANYL CITRATE (PF) 100 MCG/2ML IJ SOLN: 25.0000 ug | INTRAMUSCULAR | Status: DC | PRN

## 2017-03-16 MED ORDER — SODIUM CHLORIDE 0.9 % IR SOLN
Status: DC | PRN
  Administered 2017-03-16: 15:00:00

## 2017-03-16 MED ORDER — THROMBIN (RECOMBINANT) 20000 UNITS EX SOLR
CUTANEOUS | Status: DC | PRN
  Administered 2017-03-16: 16:00:00 via TOPICAL

## 2017-03-16 MED ORDER — ONDANSETRON HCL 4 MG PO TABS: 4.0000 mg | ORAL_TABLET | Freq: Four times a day (QID) | ORAL | Status: DC | PRN

## 2017-03-16 MED ORDER — MENTHOL 3 MG MT LOZG: 1.0000 | LOZENGE | OROMUCOSAL | Status: DC | PRN

## 2017-03-16 MED ORDER — FLEET ENEMA 7-19 GM/118ML RE ENEM
1.0000 | ENEMA | Freq: Once | RECTAL | Status: DC | PRN
  Filled 2017-03-16: qty 1

## 2017-03-16 MED ORDER — 0.9 % SODIUM CHLORIDE (POUR BTL) OPTIME
TOPICAL | Status: DC | PRN
  Administered 2017-03-16: 1000 mL

## 2017-03-16 MED ORDER — HYDROMORPHONE HCL 1 MG/ML IJ SOLN
1.0000 mg | INTRAMUSCULAR | Status: DC | PRN
  Administered 2017-03-16: 1 mg via INTRAVENOUS
  Filled 2017-03-16: qty 1

## 2017-03-16 MED ORDER — LACTATED RINGERS IV SOLN
INTRAVENOUS | Status: DC
  Administered 2017-03-16: 13:00:00 via INTRAVENOUS

## 2017-03-16 MED ORDER — ZOLPIDEM TARTRATE 5 MG PO TABS: 5.0000 mg | ORAL_TABLET | Freq: Every evening | ORAL | Status: DC | PRN

## 2017-03-16 MED ORDER — METHOCARBAMOL 1000 MG/10ML IJ SOLN
500.0000 mg | Freq: Four times a day (QID) | INTRAVENOUS | Status: DC | PRN
  Administered 2017-03-18 – 2017-03-22 (×4): 500 mg via INTRAVENOUS
  Filled 2017-03-16 (×6): qty 5

## 2017-03-16 SURGICAL SUPPLY — 74 items
BAG DECANTER FOR FLEXI CONT (MISCELLANEOUS) ×3 IMPLANT
BASKET BONE COLLECTION (BASKET) ×3 IMPLANT
BENZOIN TINCTURE PRP APPL 2/3 (GAUZE/BANDAGES/DRESSINGS) IMPLANT
BLADE CLIPPER SURG (BLADE) IMPLANT
BLADE SURG 11 STRL SS (BLADE) ×3 IMPLANT
BLADE ULTRA TIP 2M (BLADE) IMPLANT
BUR MATCHSTICK NEURO 3.0 LAGG (BURR) ×3 IMPLANT
BUR ROUND FLUTED 4 SOFT TCH (BURR) ×2 IMPLANT
BUR ROUND FLUTED 4MM SOFT TCH (BURR) ×1
BUR ROUND FLUTED 5 RND (BURR) IMPLANT
BUR ROUND FLUTED 5MM RND (BURR)
CAGE ALTITUDE 26X13 (Cage) ×2 IMPLANT
CAGE ALTITUDE 26X13MM (Cage) ×1 IMPLANT
CANISTER SUCT 3000ML PPV (MISCELLANEOUS) ×3 IMPLANT
CARTRIDGE OIL MAESTRO DRILL (MISCELLANEOUS) ×1 IMPLANT
CLOSURE WOUND 1/2 X4 (GAUZE/BANDAGES/DRESSINGS)
CONT SPEC 4OZ CLIKSEAL STRL BL (MISCELLANEOUS) ×9 IMPLANT
DECANTER SPIKE VIAL GLASS SM (MISCELLANEOUS) ×3 IMPLANT
DERMABOND ADVANCED (GAUZE/BANDAGES/DRESSINGS) ×2
DERMABOND ADVANCED .7 DNX12 (GAUZE/BANDAGES/DRESSINGS) ×1 IMPLANT
DIFFUSER DRILL AIR PNEUMATIC (MISCELLANEOUS) ×3 IMPLANT
DRAIN CHANNEL 10M FLAT 3/4 FLT (DRAIN) IMPLANT
DRAPE C-ARM 42X72 X-RAY (DRAPES) ×6 IMPLANT
DRAPE HALF SHEET 40X57 (DRAPES) ×3 IMPLANT
DRAPE LAPAROTOMY 100X72 PEDS (DRAPES) ×3 IMPLANT
DRAPE MICROSCOPE LEICA (MISCELLANEOUS) ×3 IMPLANT
DRAPE POUCH INSTRU U-SHP 10X18 (DRAPES) ×3 IMPLANT
DRSG OPSITE 4X5.5 SM (GAUZE/BANDAGES/DRESSINGS) ×6 IMPLANT
DRSG OPSITE POSTOP 3X4 (GAUZE/BANDAGES/DRESSINGS) ×3 IMPLANT
DURAPREP 6ML APPLICATOR 50/CS (WOUND CARE) ×3 IMPLANT
ELECT COATED BLADE 2.86 ST (ELECTRODE) ×3 IMPLANT
ELECT REM PT RETURN 9FT ADLT (ELECTROSURGICAL) ×3
ELECTRODE REM PT RTRN 9FT ADLT (ELECTROSURGICAL) ×1 IMPLANT
EVACUATOR SILICONE 100CC (DRAIN) IMPLANT
GAUZE SPONGE 4X4 16PLY XRAY LF (GAUZE/BANDAGES/DRESSINGS) IMPLANT
GLOVE BIO SURGEON STRL SZ7.5 (GLOVE) IMPLANT
GLOVE BIOGEL PI IND STRL 7.5 (GLOVE) ×3 IMPLANT
GLOVE BIOGEL PI INDICATOR 7.5 (GLOVE) ×6
GLOVE ECLIPSE 7.0 STRL STRAW (GLOVE) ×6 IMPLANT
GLOVE EXAM NITRILE LRG STRL (GLOVE) IMPLANT
GLOVE EXAM NITRILE XL STR (GLOVE) IMPLANT
GLOVE EXAM NITRILE XS STR PU (GLOVE) IMPLANT
GLOVE SS BIOGEL STRL SZ 7 (GLOVE) ×2 IMPLANT
GLOVE SUPERSENSE BIOGEL SZ 7 (GLOVE) ×4
GOWN STRL REUS W/ TWL LRG LVL3 (GOWN DISPOSABLE) ×2 IMPLANT
GOWN STRL REUS W/ TWL XL LVL3 (GOWN DISPOSABLE) IMPLANT
GOWN STRL REUS W/TWL 2XL LVL3 (GOWN DISPOSABLE) IMPLANT
GOWN STRL REUS W/TWL LRG LVL3 (GOWN DISPOSABLE) ×4
GOWN STRL REUS W/TWL XL LVL3 (GOWN DISPOSABLE)
GRAFT DURAGEN MATRIX 1WX1L (Tissue) ×3 IMPLANT
HEMOSTAT POWDER KIT SURGIFOAM (HEMOSTASIS) ×3 IMPLANT
KIT BASIN OR (CUSTOM PROCEDURE TRAY) ×3 IMPLANT
KIT ROOM TURNOVER OR (KITS) ×3 IMPLANT
MARKER SKIN DUAL TIP RULER LAB (MISCELLANEOUS) ×6 IMPLANT
NEEDLE HYPO 25X1 1.5 SAFETY (NEEDLE) ×3 IMPLANT
NEEDLE SPNL 22GX3.5 QUINCKE BK (NEEDLE) ×3 IMPLANT
NS IRRIG 1000ML POUR BTL (IV SOLUTION) ×3 IMPLANT
OIL CARTRIDGE MAESTRO DRILL (MISCELLANEOUS) ×3
PACK LAMINECTOMY NEURO (CUSTOM PROCEDURE TRAY) ×3 IMPLANT
PAD ARMBOARD 7.5X6 YLW CONV (MISCELLANEOUS) ×9 IMPLANT
PLATE 2 ATLANTIS TRANS 6190045 (Plate) ×3 IMPLANT
PUTTY DBM GRAFTON 5CC (Putty) ×3 IMPLANT
RUBBERBAND STERILE (MISCELLANEOUS) ×6 IMPLANT
SCREW SELF TAP VAR 4.0X13 (Screw) ×12 IMPLANT
SPONGE INTESTINAL PEANUT (DISPOSABLE) ×3 IMPLANT
SPONGE SURGIFOAM ABS GEL 100 (HEMOSTASIS) ×3 IMPLANT
STRIP CLOSURE SKIN 1/2X4 (GAUZE/BANDAGES/DRESSINGS) IMPLANT
SUT ETHILON 3 0 FSL (SUTURE) IMPLANT
SUT VIC AB 3-0 SH 8-18 (SUTURE) ×3 IMPLANT
SUT VICRYL 3-0 RB1 18 ABS (SUTURE) ×6 IMPLANT
TAPE CLOTH 3X10 TAN LF (GAUZE/BANDAGES/DRESSINGS) ×3 IMPLANT
TOWEL GREEN STERILE (TOWEL DISPOSABLE) ×3 IMPLANT
TOWEL GREEN STERILE FF (TOWEL DISPOSABLE) ×3 IMPLANT
WATER STERILE IRR 1000ML POUR (IV SOLUTION) ×3 IMPLANT

## 2017-03-16 NOTE — Progress Notes (Addendum)
Patient noted to have pear-shaped diamond ring (gold) and thin gold wedding band on left ring finger. Mel, RN at bedside during discussion with patient about placing jewelry with security for safe-keeping. Per patient authorization Mel removed rings and placed in security envelope. Patient unable to sign security form due to paralysis. Patient scheduled to go to neurosurgery today. Mel RN to take envelope/jewlery to security. Admission form filled out in Epic to reflect jewelry with security. Charge nurse Juanetta Snow, Rule notified of this information. Will also notify patient's sister that this jewelry will be with security. Alison Reef Londin Antone,RN  1307: Spoke with patient's sister, Alison Fisher, regarding rings. Rose verbalized understanding that the rings are with security here at [facility].

## 2017-03-16 NOTE — Progress Notes (Signed)
Telephone consent received from patients sister: Delfin Gant [phone]

## 2017-03-16 NOTE — ED Notes (Signed)
Pt oxygen levels dropped to 88% on 4L [place], neurosurgery at bedside, pt placed on venturi mask and is not tolerating well stating that she cannot have that on her face, patient placed back on 8L [place] and tolerating well, oxygen levels 93%.  Will continue to monitor.

## 2017-03-16 NOTE — Progress Notes (Signed)
Spoke with patient's neighbor Candace "Candy" Herb. Candy stated she took the patient's black purse from the ED and she currently has it in her possession. Patient's sister Kalman Shan notified of purse's location. Pt currently in the [place]

## 2017-03-16 NOTE — ED Notes (Signed)
Chaplin at bedside

## 2017-03-16 NOTE — Progress Notes (Signed)
Patient now awake as ativan has worn off.  Updated patient on what was going on and what MRI findings were.  Patient is understandably upset at the news.  RN contacting chaplin.  Will try and call NS to let them know patient awake and able to discuss surgery now.

## 2017-03-16 NOTE — Anesthesia Preprocedure Evaluation (Addendum)
Anesthesia Evaluation  Patient identified by MRN, date of birth, ID band  Reviewed: Allergy & Precautions, NPO status , Patient's Chart, lab work & pertinent test results  Airway Mallampati: III  TM Distance: >3 FB Neck ROM: Limited    Dental  (+) Poor Dentition, Dental Advisory Given, Missing, Caps   Pulmonary Current Smoker,    Pulmonary exam normal breath sounds clear to auscultation + decreased breath sounds      Cardiovascular negative cardio ROS Normal cardiovascular exam Rhythm:Regular Rate:Normal     Neuro/Psych Quadriparesis negative psych ROS   GI/Hepatic negative GI ROS, Neg liver ROS,   Endo/Other  negative endocrine ROS  Renal/GU negative Renal ROS  negative genitourinary   Musculoskeletal Pathologic Fx C-spine 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   Abdominal   Peds  Hematology negative hematology ROS (+)   Anesthesia Other Findings   Reproductive/Obstetrics                         Anesthesia Physical Anesthesia Plan  ASA: III  Anesthesia Plan: General   Post-op Pain Management:    Induction: Intravenous  PONV Risk Score and Plan: 2 and Ondansetron, Dexamethasone and Treatment may vary due to age or medical condition  Airway Management Planned: Oral ETT and Video Laryngoscope Planned  Additional Equipment:   Intra-op Plan:   Post-operative Plan: Extubation in OR  Informed Consent: I have reviewed the patients History and Physical, chart, labs and discussed the procedure including the risks, benefits and alternatives for the proposed anesthesia with the patient or authorized representative who has indicated his/her understanding and acceptance.     Plan Discussed with: CRNA, Anesthesiologist and Surgeon  Anesthesia Plan Comments:       Anesthesia Quick Evaluation

## 2017-03-16 NOTE — Progress Notes (Signed)
No issues overnight. More awake from earlier after Ativan for MRI has worn off. Tells me she has been getting very weak in arms and legs over last 3 or 4 days, but has noted generalized fatigue and malaise since beginning of Feb. She slid off couch some time early Sunday am and was unable to move after. Her neighbor called 911 and she was brought to the ED.  She does not report any known cancer history, no previous medical history.  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 There is also significant prevertebral extension. CT demonstrates erosion/destruction of both C5 and C6 vertebral bodies with collapse of the disc at this level. There is relatively preserved cervical lordosis with kyphotic angulation at this time.  IMPRESSION:  77 y.o. female with new diagnosis of cervical mass and severe spinal cord compression, neurologically complete paraplegia. As she does not have a diagnosis, she will need tissue and I therefore do think that proceeding with surgical decompression is reasonable.  PLAN: - Will proceed with C5-C6 corpectomy and fusion   I have reviewed the situation with the patient. Alternatives to surgery were discussed including possibility of simple biopsy and subsequent likely chemo/XRT. We also discussed risks of surgery including dysphagia, dysphonia, neck hematoma, stroke, infection, bleeding. Possible outcomes were also reviewed including lack of improvement. All her questions were answered, she indicated willingness to proceed.

## 2017-03-16 NOTE — Anesthesia Postprocedure Evaluation (Signed)
Anesthesia Post Note  Patient: Alison Fisher  Procedure(s) Performed: CERVICAL FIVE- CERVICAL SIX CORPECTOMY, FUSION WITH PLATING CERVICAL FOUR TO CERVICAL SEVEN (N/A Neck)     Patient location during evaluation: PACU Anesthesia Type: General Level of consciousness: awake and alert Pain management: pain level controlled Vital Signs Assessment: post-procedure vital signs reviewed and stable Respiratory status: spontaneous breathing, nonlabored ventilation, respiratory function stable and patient connected to nasal cannula oxygen Cardiovascular status: blood pressure returned to baseline and stable Postop Assessment: no apparent nausea or vomiting Anesthetic complications: no    Last Vitals:  Vitals:   03/21/2017 1706 03/27/2017 1711  BP: (!) 87/47 (!) 94/42  Pulse: 84 86  Resp: (!) 39 (!) 25  Temp:    SpO2: 90% 92%    Last Pain:  Vitals:   03/22/2017 1711  TempSrc:   PainSc: Asleep                 Alison Fisher A.

## 2017-03-16 NOTE — ED Notes (Signed)
Tiffany, Network engineer, to page chaplain to notify pt is requesting their services. Pt updated on plan.

## 2017-03-16 NOTE — Progress Notes (Signed)
Patient's rings deposited in security office. Security form taken to short stay and given to patient's short stay nurse at desk. Nurse placed form in patient's chart.

## 2017-03-16 NOTE — Progress Notes (Signed)
Patient's black purse sent home with patient's friend Freida Busman.

## 2017-03-16 NOTE — Progress Notes (Signed)
Called EMS shift manager at [phone] to inquire about patient's belongings. Mr Alison Fisher stated that the patient had a black canvas bag that was deposited with her and in her possession at [facility]. Mr. Alison Fisher stated patient was taken from [place] to [facility] "probably by [facility], not by EMS."

## 2017-03-16 NOTE — Progress Notes (Signed)
Patient will need to wear aspen hard c collar at all times post operatively. Order placed.

## 2017-03-16 NOTE — Op Note (Signed)
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 Corpectomy (>50%) at C5 and C6 for decompression of spinal cord and exiting nerve roots  2. Placement of intervertebral biomechanical device, Medtronic titanium expandable cage 3. Placement of anterior instrumentation consisting of interbody plate and screws spanning C4-C7 4. Use of morselized bone allograft  5. Arthrodesis C4-C7, anterior interbody technique  6. Use of intraoperative microscope  SURGEON: Dr. Consuella Lose, MD  ASSISTANT: Dr. Charlie Pitter, MD  ANESTHESIA: General Endotracheal  EBL: 200cc  SPECIMENS:  1. Prevertebral tumor 2. C5/C6 vertebral body  DRAINS: None  COMPLICATIONS: None immediate  CONDITION: Hemodynamically stable to PACU  HISTORY: Alison Fisher is a 77 y.o. woman admitted through the emergency department after presenting with essentially complete quadriplegia at C5 level after slipping off the couch.  Further questioning reveals the patient has had a rapidly progressive generalized weakness over the last several days, although she has had slow progressive weakness starting at the beginning of February.  She does not have any known cancer history.  Workup in the emergency department included MRI of the cervical spine which demonstrated a large osseous based tumor at C5 and C6 with significant prevertebral and epidural component.  The epidural component did result in severe spinal cord compression with T2 signal change.  Given the acuity of her presentation, surgery for both diagnosis and decompression was indicated.  The risks and benefits of the surgery were explained in detail to both the patient and her sister over the phone.  After all questions were answered informed consent was obtained and witnessed by her sister.  PROCEDURE IN DETAIL: The patient was brought to the operating room and transferred to the operative table. After induction of general  anesthesia, the patient was positioned on the operative table in the supine position with all pressure points meticulously padded. The skin of the neck was then prepped and draped in the usual sterile fashion.  After timeout was conducted, the skin was infiltrated with local anesthetic. Skin incision was then made sharply and Bovie electrocautery was used to dissect the subcutaneous tissue until the platysma was identified. The platysma was then divided and undermined. The sternocleidomastoid muscle was then identified and, utilizing natural fascial planes in the neck, the prevertebral fascia was identified and the carotid sheath was retracted laterally and the trachea and esophagus retracted medially.  Immediately underneath the carotid artery the longus coli muscle was identified and noted to be significantly thickened, with slightly bluish color and somewhat vascular suggesting invasion by tumor.  Retractors were then placed.  Dissectors were placed at what appeared to be the inferior endplate of C4 as well as the superior endplate of C7 delimiting the corpectomy.  This was confirmed with intraoperative fluoroscopy.  At this point, the microscope was draped and brought into the field, and the remainder of the case was done under the microscope using microdissecting technique.  The C4-5 disc space was incised sharply and rongeurs were used to remove the C4-5 disc space as well as large portions of the C5 vertebral body which were clearly invaded by tumor.  Pieces of disc and vertebral body were sent for permanent pathology.  In a similar fashion, the C5-6 and C6-7 disc spaces were identified and removed, and sent for pathology.  Ultimately, the posterior longitudinal ligament was identified near the C7 endplate.  This was elevated with a nerve hook, and removed with the Kerrison rongeurs.  Identifying the epidural  plane, the remaining ventral epidural tumor was then dissected away from the thecal sac and  removed.  There was a small amount of tumor behind the C4 vertebral body which I was able to remove with Kerrison rongeurs.  The thecal sac was noted to be well decompressed, and was actually expanding into the corpectomy defect.  A small durotomy was created during decompression although the arachnoid appeared to be intact without any actual egressive CSF.  The dural defect was covered with a small piece of DuraGen collagen matrix.  A 26 mm Medtronic titanium expandable interbody cage was then sized and filled with bone allograft, and tapped into place, and expanded to approximately 33-34 mm. Position of the interbody devices was then confirmed with fluoroscopy.  After placement of the intervertebral device, the Medtronic translational anterior cervical plate was selected, and placed across the interspaces. Using a high-speed drill, the cortex of the cervical vertebral bodies was punctured, and screws inserted in the C4 and C7 levels. Final fluoroscopic images in lateral projection was taken to confirm good hardware placement.  At this point, after all counts were verified to be correct, meticulous hemostasis was secured using a combination of bipolar electrocautery and passive hemostatics. The platysma muscle was then closed using interrupted 3-0 Vicryl sutures, and the skin was closed with a interrupted subcuticular stitch. Sterile dressings were then applied and the drapes removed.  The patient tolerated the procedure well and was taken to the postanesthesia care unit in stable condition.  At the end of the case all sponge, needle, instrument, and cottonoid counts were correct.

## 2017-03-16 NOTE — Progress Notes (Signed)
Spoke with "Doren Custard" from [place]. Doren Custard stated patient was taken from [facility] to [facility] by Karen Chafe and nurse "Rayford Halsted" signed for belongings. Doren Custard stated they did not write what the belongings were, only that they received a signature when patient arrived to ED.

## 2017-03-16 NOTE — ED Notes (Addendum)
Patient states she had a black bag with her when she went to the emergency room. Patient was first at [facility] ED before being transported to [facility] ED. Patient did not arrive to [address] with a black bag. Will call the [facility] ED to attempt to locate. Will continue to monitor. Roselyn Reef Elyzabeth Goatley,RN   1113: per Loma Sousa, RN in [facility] ED she did not see a black back in patient's room. Will call [facility] now.   1123: Spoke with Santiago Glad at the [facility] ED. She was unable to locate a black bag in the lost and found. Will notify patient.

## 2017-03-16 NOTE — Progress Notes (Signed)
PROGRESS NOTE  Alison Fisher MWN:[id number] DOB: 1940-01-21 DOA: 03/13/2017 PCP: Patient, No Pcp Per  HPI/Recap of past 15 hours: 77 year old female with past medical history of ongoing tobacco abuse and suspected COPD who for the past month has been having worsening neck pain and arm weakness which was initially thought to perhaps be a pinched nerve and was started on steroids for this and brought  to the emergency room after she was unable to get up off the floor after slipping off of the couch.  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  Patient is found to have nearly complete quadriplegia upon arrival to the emergency room.  Admitted to the hospitalist service.  Case discussed with neurosurgery who took patient to the operating room on the afternoon of 3/11.  Also incidentally noted to be hypoxic with requiring 2 L nasal cannula continuously.  Patient seen prior to surgery.  Tearful, anxious, feels very weak.  Complains of her chronic neck pain.  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  Does not appear to be mature metastatic as full body skin notes no other solid mass lesions.  Suspicious for lymphoma or plasmacytoma.  Pathology pending.  Neuro ICU following surgery.  Acute respiratory failure with hypoxia: Suspect COPD exacerbation.  Given previous tobacco abuse which is ongoing.  Oxygen and nebulizers.  Less likely aspiration pneumonia although one may make that arrangement given her cervical cord issues, however no white count, no fever and no evidence of pneumonia seen on CT scan.  We will continue to follow Active Problems: ?  Protein calorie malnutrition:  Nutrition to see   Code Status: Full code  Family Communication: Left message for sister who lives in [place]  Disposition Plan: Dependent upon pathology   Consultants:  Neurosurgery  Procedures:  Status post cervical fusion by neurosurgery/tumor removal  Antimicrobials:  None  DVT prophylaxis: SCDs   Objective: Vitals:   03/25/2017 1705 03/22/2017 1706 03/23/2017 1711 04/03/2017 1720  BP: (!) 85/43 (!) 87/47 (!) 94/42 (!) 99/44  Pulse: 87 84 86 85  Resp: (!) 37 (!) 39 (!) 25 19  Temp:    (!) 97.4 F (36.3 C)  TempSrc:      SpO2: 90% 90% 92% 94%    Intake/Output Summary (Last 24 hours) at 04/02/2017 1843 Last data filed at 03/13/2017 1720 Gross per 24 hour  Intake 3049.75 ml  Output 1250 ml  Net 1799.75 ml   There were no vitals filed for this visit. There is no height or weight on file to calculate BMI.  Exam:   General: Alert and oriented x3, fatigued  HEENT: Normocephalic, atraumatic, mucous members are dry  Neck: Supple, no JVD, tender  Cardiovascular: Regular rate and rhythm, S1-S2  Respiratory: Clear to auscultation bilaterally, decreased breath sounds throughout  Abdomen: Soft, nontender, nondistended, positive bowel sounds  Musculoskeletal: No clubbing or cyanosis, trace pitting edema  Neuro: Decreased grip, flexion and extension upper and lower extremities 3+/5  Skin: No skin breaks, tears or lesions  Psychiatry: Appropriate, no evidence of psychosis   Data Reviewed: CBC: Recent Labs  Lab 04/01/2017 1630 03/15/17 1635  WBC 15.7*  --   NEUTROABS 13.1*  --   HGB 16.4* 17.3*  HCT 48.4* 51.0*  MCV 100.4*  --   PLT 315  --    Basic Metabolic Panel: Recent Labs  Lab 03/30/2017 1630 03/31/2017 1635  NA 134* 134*  K 3.9 4.0  CL 92* 92*  CO2 30  --   GLUCOSE 91 90  BUN 16 19  CREATININE 0.43* 0.50  CALCIUM 9.1  --    GFR: Estimated Creatinine Clearance: 37.4 mL/min (by C-G formula based on SCr of 0.5 mg/dL). Liver Function  Tests: Recent Labs  Lab 03/13/2017 1630  AST 30  ALT 30  ALKPHOS 69  BILITOT 1.3*  PROT 6.8  ALBUMIN 3.7   No results for input(s): LIPASE, AMYLASE in the last 168 hours. No results for input(s): AMMONIA in the last 168 hours. Coagulation Profile: Recent Labs  Lab 04/01/2017 1630  INR 0.89   Cardiac Enzymes: Recent Labs  Lab 03/20/2017 1630  CKTOTAL 222  TROPONINI <0.03   BNP (last 3 results) No results for input(s): PROBNP in the last 8760 hours. HbA1C: No results for input(s): HGBA1C in the last 72 hours. CBG: Recent Labs  Lab 03/23/2017 0207  GLUCAP 90   Lipid Profile: No results for input(s): CHOL, HDL, LDLCALC, TRIG, CHOLHDL, LDLDIRECT in the last 72 hours. Thyroid Function Tests: No results for input(s): TSH, T4TOTAL, FREET4, T3FREE, THYROIDAB in the last 72 hours. Anemia Panel: No results for input(s): VITAMINB12, FOLATE, FERRITIN, TIBC, IRON, RETICCTPCT in the last 72 hours. Urine analysis:    Component Value Date/Time   COLORURINE YELLOW 04/04/2017 1713   APPEARANCEUR HAZY (A) 03/14/2017 1713   LABSPEC 1.013 03/17/2017 1713   PHURINE 7.0 03/25/2017 1713   GLUCOSEU NEGATIVE 04/03/2017 1713   HGBUR NEGATIVE 04/03/2017 1713   BILIRUBINUR NEGATIVE 03/19/2017 1713   KETONESUR 5 (A) 03/29/2017 1713   PROTEINUR NEGATIVE 03/26/2017 1713   NITRITE NEGATIVE 03/19/2017 1713   LEUKOCYTESUR NEGATIVE 04/02/2017 1713   Sepsis Labs: @LABRCNTIP (procalcitonin:4,lacticidven:4)  ) Recent Results (from the past 240 hour(s))  Surgical pcr screen     Status: None   Collection Time: 03/23/2017  8:22 AM  Result Value Ref Range Status   MRSA, PCR NEGATIVE NEGATIVE Final   Staphylococcus aureus NEGATIVE NEGATIVE Final    Comment: (NOTE) The Xpert SA Assay (FDA approved for NASAL specimens in patients 54 years of age and older), is one component of a comprehensive surveillance program. It is not intended to diagnose infection nor to guide or monitor treatment. Performed at  [facility], [place] [address] [place], [place] [address]       Studies: Dg Cervical Spine 2-3 Views  Result Date: 03/14/2017 CLINICAL DATA:  Tumor of the cervical spine compressing the cervical spinal cord. Pathologic destruction the C5 and C6 vertebral bodies. EXAM: DG C-ARM 61-120 MIN; CERVICAL SPINE - 2-3 VIEW COMPARISON:  CT scan MRI dated 04/04/2017 FINDINGS: AP and lateral views of the cervical spine demonstrate the patient has undergone corpectomy at C5 and C6 with anterior fusion from C4 to C7. Titanium expandable cage placed. Hardware appears in good position in the AP and lateral projections. IMPRESSION: Corpectomy with anterior fusion from C4-C7 with cage insertion. FLUOROSCOPY TIME:  C-arm fluoroscopic images were obtained intraoperatively and submitted for post operative interpretation. Please see the performing provider's procedural report for the fluoroscopy time utilized. Electronically Signed   By: Lorriane Shire M.D.   On: 04/02/2017 17:45   Ct Chest W Contrast  Result Date: 03/16/2017 CLINICAL DATA:  77 year old female with C-spine mass and associated pathologic fracture seen on  earlier CT and MRI. EXAM: CT CHEST, ABDOMEN, AND PELVIS WITH CONTRAST TECHNIQUE: Multidetector CT imaging of the chest, abdomen and pelvis was performed following the standard protocol during bolus administration of intravenous contrast. CONTRAST:  112mL ISOVUE-300 IOPAMIDOL (ISOVUE-300) INJECTION 61% COMPARISON:  C-spine CT dated 03/23/2017 an MRI dated 03/17/2017 FINDINGS: CT CHEST FINDINGS Cardiovascular: There is no cardiomegaly or pericardial effusion. Multi vessel coronary vascular calcification noted. There is moderate atherosclerotic calcification of the thoracic aorta. No aneurysmal dilatation or evidence of dissection. The visualized origins of the great vessels of the aortic arch appear patent. The central pulmonary arteries are grossly unremarkable as visualized. Mediastinum/Nodes: There is  no hilar or mediastinal adenopathy. The esophagus and the thyroid gland are grossly unremarkable. No mediastinal fluid collection. Lungs/Pleura: There are bibasilar linear atelectasis/scarring. Probable trace left pleural effusion and left posterior pleural base thickening. A 10 x 13 mm focal subpleural nodular density in the right lower lobe (series 4, image 103) may represent an area of scarring. A 6 x 10 mm subpleural nodular density in the right upper lobe (series 4, image 39) may also represent an area of scarring. Other etiologies are not excluded. There is no focal consolidation, pleural effusion, or pneumothorax. Mucus content noted in the trachea. The central airways are patent. Musculoskeletal: Partially visualized destructive lytic mass along the anterior lower cervical spine better evaluated on the prior CT and MRI. There is osteopenia with degenerative changes of the spine. No acute osseous pathology of the thoracic cage. CT ABDOMEN PELVIS FINDINGS No intra-abdominal free air or free fluid. Hepatobiliary: The liver is unremarkable. No intrahepatic biliary ductal dilatation. The gallbladder is unremarkable. Pancreas: Unremarkable. No pancreatic ductal dilatation or surrounding inflammatory changes. Spleen: Normal in size without focal abnormality. Adrenals/Urinary Tract: There is an indeterminate 10 mm right adrenal nodule as well as a 7 mm left adrenal nodule. MRI may provide better characterisation. The kidneys are unremarkable. There is uniform enhancement and symmetric excretion of contrast by both kidneys. The visualized ureters and urinary bladder appear unremarkable. Stomach/Bowel: There is extensive sigmoid diverticulosis without active inflammatory changes. There is no bowel obstruction or active inflammation. The appendix is normal. Vascular/Lymphatic: There is advanced aortoiliac atherosclerotic disease. No portal venous gas. There is no adenopathy. Reproductive: The uterus is somewhat  atrophic. There is thickened appearance of the endometrium measuring approximately 13 mm. Further evaluation with pelvic ultrasound recommended. The ovaries are grossly unremarkable. Other: None Musculoskeletal: Osteopenia with degenerative changes of the spine. A 12 mm sclerotic focus in the upper sacrum, likely a bone island. No acute osseous pathology. IMPRESSION: 1. No acute intrathoracic, abdominal, or pelvic pathology. No definite malignancy or metastatic disease identified in the chest, abdomen, or pelvis. 2. Right lung subpleural nodular densities as described, likely areas of scarring. Clinical correlation is recommended. 3. Thickened appearance of the endometrium. Further evaluation with pelvic ultrasound recommended. 4. Partially visualized lower cervical spine destructive mass, better seen and evaluated on the prior CT and MRI. 5. Indeterminate bilateral adrenal nodules. MRI may provide better characterisation. 6. Nonacute findings as described above. Electronically Signed   By: Anner Crete M.D.   On: 03/10/2017 00:03   Ct Cervical Spine Wo Contrast  Result Date: 04/02/2017 CLINICAL DATA:  Cervical fracture. EXAM: CT CERVICAL SPINE WITHOUT CONTRAST TECHNIQUE: Multidetector CT imaging of the cervical spine was performed without intravenous contrast. Multiplanar CT image reconstructions were also generated. COMPARISON:  MRI of the cervical spine, 03/12/2017 FINDINGS: Alignment: Mild kyphosis centered at C5-C6.  No spondylolisthesis. Skull  base and vertebrae: There is a lytic destructive lesion extending throughout most of the C5 and C6 vertebral bodies. There is marked loss of disc height at this level. Small lucencies are noted in the C7 vertebra. The lytic destructive lesion at C5-C6 has a soft tissue component that bulges into the prevertebral soft tissues elevating the overlying pharynx and larynx. The soft tissue mass including the bone lesion component, measures approximately 5.2 x 2.9 x 3.4  cm. There is moderate loss of height of the C5 vertebra with mild loss of height of the C6 vertebra, along with the loss of disc height. Soft tissues and spinal canal: There is posterior bulging of the C5 vertebra encroaching upon the spinal canal, better evaluated on the current MRI. Disc levels: Moderate loss of disc height is noted at C6-C7, which appears degenerative. Upper chest: No other soft tissue masses. No discrete enlarged lymph nodes. Lung apices are clear. Other: None. IMPRESSION: 1. Pathologic fracture of C5 due to a large infiltrative mass that involves C5 and C6, extends along the prevertebral soft tissues and encroaches upon the ventral epidural space, better assessed on the current cervical MRI. This may reflect metastatic disease or a primary bone neoplasm such as lymphoma. Electronically Signed   By: Lajean Manes M.D.   On: 04/05/2017 21:33   Ct Abdomen Pelvis W Contrast  Result Date: 03/07/2017 CLINICAL DATA:  77 year old female with C-spine mass and associated pathologic fracture seen on earlier CT and MRI. EXAM: CT CHEST, ABDOMEN, AND PELVIS WITH CONTRAST TECHNIQUE: Multidetector CT imaging of the chest, abdomen and pelvis was performed following the standard protocol during bolus administration of intravenous contrast. CONTRAST:  167mL ISOVUE-300 IOPAMIDOL (ISOVUE-300) INJECTION 61% COMPARISON:  C-spine CT dated 04/04/2017 an MRI dated 03/23/2017 FINDINGS: CT CHEST FINDINGS Cardiovascular: There is no cardiomegaly or pericardial effusion. Multi vessel coronary vascular calcification noted. There is moderate atherosclerotic calcification of the thoracic aorta. No aneurysmal dilatation or evidence of dissection. The visualized origins of the great vessels of the aortic arch appear patent. The central pulmonary arteries are grossly unremarkable as visualized. Mediastinum/Nodes: There is no hilar or mediastinal adenopathy. The esophagus and the thyroid gland are grossly unremarkable. No  mediastinal fluid collection. Lungs/Pleura: There are bibasilar linear atelectasis/scarring. Probable trace left pleural effusion and left posterior pleural base thickening. A 10 x 13 mm focal subpleural nodular density in the right lower lobe (series 4, image 103) may represent an area of scarring. A 6 x 10 mm subpleural nodular density in the right upper lobe (series 4, image 39) may also represent an area of scarring. Other etiologies are not excluded. There is no focal consolidation, pleural effusion, or pneumothorax. Mucus content noted in the trachea. The central airways are patent. Musculoskeletal: Partially visualized destructive lytic mass along the anterior lower cervical spine better evaluated on the prior CT and MRI. There is osteopenia with degenerative changes of the spine. No acute osseous pathology of the thoracic cage. CT ABDOMEN PELVIS FINDINGS No intra-abdominal free air or free fluid. Hepatobiliary: The liver is unremarkable. No intrahepatic biliary ductal dilatation. The gallbladder is unremarkable. Pancreas: Unremarkable. No pancreatic ductal dilatation or surrounding inflammatory changes. Spleen: Normal in size without focal abnormality. Adrenals/Urinary Tract: There is an indeterminate 10 mm right adrenal nodule as well as a 7 mm left adrenal nodule. MRI may provide better characterisation. The kidneys are unremarkable. There is uniform enhancement and symmetric excretion of contrast by both kidneys. The visualized ureters and urinary bladder appear unremarkable. Stomach/Bowel: There  is extensive sigmoid diverticulosis without active inflammatory changes. There is no bowel obstruction or active inflammation. The appendix is normal. Vascular/Lymphatic: There is advanced aortoiliac atherosclerotic disease. No portal venous gas. There is no adenopathy. Reproductive: The uterus is somewhat atrophic. There is thickened appearance of the endometrium measuring approximately 13 mm. Further evaluation  with pelvic ultrasound recommended. The ovaries are grossly unremarkable. Other: None Musculoskeletal: Osteopenia with degenerative changes of the spine. A 12 mm sclerotic focus in the upper sacrum, likely a bone island. No acute osseous pathology. IMPRESSION: 1. No acute intrathoracic, abdominal, or pelvic pathology. No definite malignancy or metastatic disease identified in the chest, abdomen, or pelvis. 2. Right lung subpleural nodular densities as described, likely areas of scarring. Clinical correlation is recommended. 3. Thickened appearance of the endometrium. Further evaluation with pelvic ultrasound recommended. 4. Partially visualized lower cervical spine destructive mass, better seen and evaluated on the prior CT and MRI. 5. Indeterminate bilateral adrenal nodules. MRI may provide better characterisation. 6. Nonacute findings as described above. Electronically Signed   By: Anner Crete M.D.   On: 03/08/2017 00:03   Dg C-arm 1-60 Min  Result Date: 03/12/2017 CLINICAL DATA:  Tumor of the cervical spine compressing the cervical spinal cord. Pathologic destruction the C5 and C6 vertebral bodies. EXAM: DG C-ARM 61-120 MIN; CERVICAL SPINE - 2-3 VIEW COMPARISON:  CT scan MRI dated 03/25/2017 FINDINGS: AP and lateral views of the cervical spine demonstrate the patient has undergone corpectomy at C5 and C6 with anterior fusion from C4 to C7. Titanium expandable cage placed. Hardware appears in good position in the AP and lateral projections. IMPRESSION: Corpectomy with anterior fusion from C4-C7 with cage insertion. FLUOROSCOPY TIME:  C-arm fluoroscopic images were obtained intraoperatively and submitted for post operative interpretation. Please see the performing provider's procedural report for the fluoroscopy time utilized. Electronically Signed   By: Lorriane Shire M.D.   On: 03/15/2017 17:45    Scheduled Meds: . acetaminophen  1,000 mg Oral Q6H  . dexamethasone  4 mg Intravenous Q6H  . docusate  sodium  100 mg Oral BID  . gabapentin  300 mg Oral TID  . senna  1 tablet Oral BID  . sodium chloride flush  3 mL Intravenous Q12H    Continuous Infusions: . sodium chloride 75 mL/hr at 03/26/2017 1500  . sodium chloride    . sodium chloride    . lactated ringers 10 mL/hr at 03/20/2017 1500  . methocarbamol (ROBAXIN)  IV    . [START ON 03/17/2017] vancomycin       LOS: 1 day     Annita Brod, MD [facility]  To reach me or the doctor on call, go to: [email or web address] Password [facility]  03/28/2017, 6:43 PM

## 2017-03-16 NOTE — ED Notes (Signed)
Pt found to be 89% on 4L [place]. Pt placed on non-rebreather 10L. Alcario Drought, MD notified. Pt O2 now 95%. Will continue to monitor.

## 2017-03-16 NOTE — Progress Notes (Signed)
Pharmacy Antibiotic Note  Alison Fisher is a 77 y.o. female admitted on 03/14/2017 with surgical prophylaxis.  Pharmacy has been consulted for vancomycin dosing.  Patient underwent corpectomy with placements of anterior instrumentation and intervertebral biomechanical device. No drains in place post-op, confirmed with nursing. Last dose of vancomycin was on 3/11 at 1355 with AET of 1654. Scr is 0.5 with CrCl adjusted for age ~31 mL/min. Consult for one time dose 12 hours post-op since no drains in place.  Plan: Vancomycin 750 mg once starting on 3/12 at 0500  Temp (24hrs), Avg:97.4 F (36.3 C), Min:97.4 F (36.3 C), Max:97.4 F (36.3 C)  Recent Labs  Lab 03/22/2017 1630 03/30/2017 1635  WBC 15.7*  --   CREATININE 0.43* 0.50    Estimated Creatinine Clearance: 37.4 mL/min (by C-G formula based on SCr of 0.5 mg/dL).    Allergies  Allergen Reactions  . Penicillins Hives    No other information available 03/09/2017 Has patient had a PCN reaction causing immediate rash, facial/tongue/throat swelling, SOB or lightheadedness with hypotension: Unknown Has patient had a PCN reaction causing severe rash involving mucus membranes or skin necrosis: Unknown Has patient had a PCN reaction that required hospitalization: Unknown Has patient had a PCN reaction occurring within the last 10 years: Unknown If all of the above answers are "NO", then may proceed with Cephalosporin use.  . Prednisone Swelling   Thank you for allowing pharmacy to be a part of this patient's care.  Doylene Canard, PharmD Clinical Pharmacist  Pager: [phone] Phone: [phone] 03/31/2017 5:56 PM

## 2017-03-16 NOTE — Anesthesia Procedure Notes (Signed)
Procedure Name: Intubation Date/Time: 03/27/2017 1:48 PM Performed by: Scheryl Darter, CRNA Pre-anesthesia Checklist: Patient identified, Emergency Drugs available, Suction available and Patient being monitored Patient Re-evaluated:Patient Re-evaluated prior to induction Oxygen Delivery Method: Circle System Utilized Preoxygenation: Pre-oxygenation with 100% oxygen Induction Type: IV induction Ventilation: Mask ventilation without difficulty Laryngoscope Size: Glidescope Tube type: Oral Number of attempts: 1 Airway Equipment and Method: Stylet and Oral airway Placement Confirmation: ETT inserted through vocal cords under direct vision,  positive ETCO2 and breath sounds checked- equal and bilateral Secured at: 19 cm Tube secured with: Tape Dental Injury: Teeth and Oropharynx as per pre-operative assessment  Difficulty Due To: Difficulty was anticipated, Difficult Airway-  due to neck instability, Difficult Airway- due to anterior larynx, Difficult Airway- due to limited oral opening and Difficult Airway- due to dentition

## 2017-03-16 NOTE — ED Notes (Signed)
Pt O2 found to be 89% on 2L [place]. Pt placed on 4L [place]. Alcario Drought, MD notified. Will continue to monitor.

## 2017-03-16 NOTE — ED Notes (Signed)
Pt titrated down to [place] 4L, tolerating well, oxygen saturation 92%, Dr. Alcario Drought at bedside. Requesting chaplin, chaplin has been paged. Will continue to monitor.

## 2017-03-16 NOTE — Transfer of Care (Signed)
Immediate Anesthesia Transfer of Care Note  Patient: Alison Fisher  Procedure(s) Performed: CERVICAL FIVE- CERVICAL SIX CORPECTOMY, FUSION WITH PLATING CERVICAL FOUR TO CERVICAL SEVEN (N/A Neck)  Patient Location: PACU  Anesthesia Type:General  Level of Consciousness: drowsy  Airway & Oxygen Therapy: Patient Spontanous Breathing and Patient connected to face mask oxygen  Post-op Assessment: Report given to RN and Post -op Vital signs reviewed and stable  Post vital signs: Reviewed and stable  Last Vitals:  Vitals:   04/01/2017 0945 04/03/2017 1652  BP: (!) 110/54 (!) 101/49  Pulse: 77 92  Resp: (!) 22 19  Temp:  (!) 36.3 C  SpO2: 91% 98%    Last Pain:  Vitals:   03/27/2017 1652  TempSrc:   PainSc: (P) Asleep         Complications: No apparent anesthesia complications

## 2017-03-16 NOTE — ED Notes (Addendum)
Alcario Drought, MD at bedside. Alcario Drought, MD explained pt status, reason for admitting, and plan of care.

## 2017-03-17 ENCOUNTER — Other Ambulatory Visit: Payer: Self-pay

## 2017-03-17 ENCOUNTER — Inpatient Hospital Stay (HOSPITAL_COMMUNITY): Payer: Medicare Other

## 2017-03-17 ENCOUNTER — Inpatient Hospital Stay: Payer: Self-pay

## 2017-03-17 ENCOUNTER — Encounter (HOSPITAL_COMMUNITY): Payer: Self-pay | Admitting: Neurosurgery

## 2017-03-17 DIAGNOSIS — J9601 Acute respiratory failure with hypoxia: Secondary | ICD-10-CM

## 2017-03-17 DIAGNOSIS — J690 Pneumonitis due to inhalation of food and vomit: Secondary | ICD-10-CM

## 2017-03-17 DIAGNOSIS — R1313 Dysphagia, pharyngeal phase: Secondary | ICD-10-CM

## 2017-03-17 DIAGNOSIS — R221 Localized swelling, mass and lump, neck: Secondary | ICD-10-CM

## 2017-03-17 DIAGNOSIS — G8250 Quadriplegia, unspecified: Secondary | ICD-10-CM

## 2017-03-17 DIAGNOSIS — M4852XA Collapsed vertebra, not elsewhere classified, cervical region, initial encounter for fracture: Principal | ICD-10-CM

## 2017-03-17 LAB — GLUCOSE, CAPILLARY
Glucose-Capillary: 100 mg/dL — ABNORMAL HIGH (ref 65–99)
Glucose-Capillary: 122 mg/dL — ABNORMAL HIGH (ref 65–99)

## 2017-03-17 LAB — POCT I-STAT 3, ART BLOOD GAS (G3+)
Acid-Base Excess: 5 mmol/L — ABNORMAL HIGH (ref 0.0–2.0)
BICARBONATE: 30.9 mmol/L — AB (ref 20.0–28.0)
O2 Saturation: 99 %
PH ART: 7.402 (ref 7.350–7.450)
PO2 ART: 159 mmHg — AB (ref 83.0–108.0)
TCO2: 32 mmol/L (ref 22–32)
pCO2 arterial: 49.5 mmHg — ABNORMAL HIGH (ref 32.0–48.0)

## 2017-03-17 LAB — APTT: aPTT: 28 s (ref 24–36)

## 2017-03-17 LAB — CBC
HCT: 38.7 % (ref 36.0–46.0)
Hemoglobin: 12.4 g/dL (ref 12.0–15.0)
MCH: 32.7 pg (ref 26.0–34.0)
MCHC: 32 g/dL (ref 30.0–36.0)
MCV: 102.1 fL — ABNORMAL HIGH (ref 78.0–100.0)
Platelets: 262 10*3/uL (ref 150–400)
RBC: 3.79 MIL/uL — ABNORMAL LOW (ref 3.87–5.11)
RDW: 13 % (ref 11.5–15.5)
WBC: 15.1 10*3/uL — ABNORMAL HIGH (ref 4.0–10.5)

## 2017-03-17 LAB — PROTIME-INR
INR: 0.96
Prothrombin Time: 12.7 seconds (ref 11.4–15.2)

## 2017-03-17 LAB — BASIC METABOLIC PANEL
Anion gap: 7 (ref 5–15)
BUN: 11 mg/dL (ref 6–20)
CO2: 29 mmol/L (ref 22–32)
Calcium: 8.4 mg/dL — ABNORMAL LOW (ref 8.9–10.3)
Chloride: 103 mmol/L (ref 101–111)
Creatinine, Ser: 0.52 mg/dL (ref 0.44–1.00)
GFR calc Af Amer: >60 mL/min (ref >60)
GFR calc non Af Amer: >60 mL/min (ref >60)
Glucose, Bld: 104 mg/dL — ABNORMAL HIGH (ref 65–99)
Potassium: 4.1 mmol/L (ref 3.5–5.1)
Sodium: 139 mmol/L (ref 135–145)

## 2017-03-17 MED ORDER — FENTANYL 2500MCG IN NS 250ML (10MCG/ML) PREMIX INFUSION
25.0000 ug/h | INTRAVENOUS | Status: DC
  Administered 2017-03-17: 50 ug/h via INTRAVENOUS
  Administered 2017-03-19 – 2017-03-22 (×2): 25 ug/h via INTRAVENOUS
  Filled 2017-03-17 (×3): qty 250

## 2017-03-17 MED ORDER — MIDAZOLAM HCL 2 MG/2ML IJ SOLN
INTRAMUSCULAR | Status: AC
  Administered 2017-03-17: 2 mg via INTRAVENOUS
  Filled 2017-03-17: qty 2

## 2017-03-17 MED ORDER — ETOMIDATE 2 MG/ML IV SOLN
20.0000 mg | Freq: Once | INTRAVENOUS | Status: AC
  Administered 2017-03-17: 20 mg via INTRAVENOUS

## 2017-03-17 MED ORDER — FENTANYL CITRATE (PF) 100 MCG/2ML IJ SOLN
INTRAMUSCULAR | Status: AC
  Filled 2017-03-17: qty 2

## 2017-03-17 MED ORDER — FENTANYL CITRATE (PF) 100 MCG/2ML IJ SOLN
100.0000 ug | Freq: Once | INTRAMUSCULAR | Status: AC
  Administered 2017-03-17: 100 ug via INTRAVENOUS

## 2017-03-17 MED ORDER — FAMOTIDINE 40 MG/5ML PO SUSR
20.0000 mg | Freq: Two times a day (BID) | ORAL | Status: DC
  Administered 2017-03-17 – 2017-03-22 (×11): 20 mg
  Filled 2017-03-17 (×14): qty 2.5

## 2017-03-17 MED ORDER — VITAL AF 1.2 CAL PO LIQD
1000.0000 mL | ORAL | Status: DC
  Administered 2017-03-17 – 2017-03-22 (×6): 1000 mL
  Filled 2017-03-17: qty 1000

## 2017-03-17 MED ORDER — FENTANYL CITRATE (PF) 100 MCG/2ML IJ SOLN: 50.0000 ug | Freq: Once | INTRAMUSCULAR | Status: DC

## 2017-03-17 MED ORDER — SODIUM CHLORIDE 0.9 % IV SOLN
Freq: Once | INTRAVENOUS | Status: AC
  Administered 2017-03-17: 21:00:00 via INTRAVENOUS

## 2017-03-17 MED ORDER — ORAL CARE MOUTH RINSE
15.0000 mL | Freq: Four times a day (QID) | OROMUCOSAL | Status: DC
  Administered 2017-03-17 – 2017-03-22 (×20): 15 mL via OROMUCOSAL

## 2017-03-17 MED ORDER — PHENYLEPHRINE HCL 10 MG/ML IJ SOLN
0.0000 ug/min | INTRAMUSCULAR | Status: DC
  Administered 2017-03-17: 20 ug/min via INTRAVENOUS
  Filled 2017-03-17: qty 1

## 2017-03-17 MED ORDER — FENTANYL BOLUS VIA INFUSION
25.0000 ug | INTRAVENOUS | Status: DC | PRN
  Administered 2017-03-17 – 2017-03-22 (×8): 25 ug via INTRAVENOUS
  Filled 2017-03-17: qty 25

## 2017-03-17 MED ORDER — VITAL HIGH PROTEIN PO LIQD: 1000.0000 mL | ORAL | Status: DC

## 2017-03-17 MED ORDER — CEFTRIAXONE SODIUM 1 G IJ SOLR
1.0000 g | INTRAMUSCULAR | Status: DC
  Administered 2017-03-17 – 2017-03-22 (×6): 1 g via INTRAVENOUS
  Filled 2017-03-17 (×7): qty 10

## 2017-03-17 MED ORDER — ROCURONIUM BROMIDE 50 MG/5ML IV SOLN
50.0000 mg | Freq: Once | INTRAVENOUS | Status: AC
  Administered 2017-03-17: 50 mg via INTRAVENOUS
  Filled 2017-03-17: qty 5

## 2017-03-17 MED ORDER — SODIUM CHLORIDE 0.9% FLUSH: 10.0000 mL | INTRAVENOUS | Status: DC | PRN

## 2017-03-17 MED ORDER — SODIUM CHLORIDE 0.9 % IV SOLN
Freq: Once | INTRAVENOUS | Status: AC
  Administered 2017-03-17: 23:00:00 via INTRAVENOUS

## 2017-03-17 MED ORDER — CHLORHEXIDINE GLUCONATE CLOTH 2 % EX PADS
6.0000 | MEDICATED_PAD | Freq: Every day | CUTANEOUS | Status: DC
  Administered 2017-03-17 – 2017-03-18 (×2): 6 via TOPICAL

## 2017-03-17 MED ORDER — PRO-STAT SUGAR FREE PO LIQD
30.0000 mL | Freq: Two times a day (BID) | ORAL | Status: DC
  Filled 2017-03-17 (×2): qty 30

## 2017-03-17 MED ORDER — SODIUM CHLORIDE 0.9% FLUSH
10.0000 mL | Freq: Two times a day (BID) | INTRAVENOUS | Status: DC
  Administered 2017-03-18 – 2017-03-19 (×3): 10 mL
  Administered 2017-03-19: 30 mL
  Administered 2017-03-20: 10 mL
  Administered 2017-03-21: 30 mL
  Administered 2017-03-22: 10 mL

## 2017-03-17 MED ORDER — CHLORHEXIDINE GLUCONATE 0.12% ORAL RINSE (MEDLINE KIT)
15.0000 mL | Freq: Two times a day (BID) | OROMUCOSAL | Status: DC
  Administered 2017-03-17 – 2017-03-22 (×11): 15 mL via OROMUCOSAL

## 2017-03-17 MED ORDER — MIDAZOLAM HCL 2 MG/2ML IJ SOLN
2.0000 mg | Freq: Once | INTRAMUSCULAR | Status: AC
  Administered 2017-03-17: 2 mg via INTRAVENOUS

## 2017-03-17 MED FILL — Thrombin For Soln 20000 Unit: CUTANEOUS | Qty: 1 | Status: AC

## 2017-03-17 MED FILL — Thrombin For Soln 5000 Unit: CUTANEOUS | Qty: 5000 | Status: AC

## 2017-03-17 NOTE — Procedures (Signed)
OGT Placement By MD  OGT placed under direct laryngoscopy and confirmed by auscultation  Wesam G. Yacoub, M.D. [facility]. Pager: [phone]. After hours pager: [phone]. 

## 2017-03-17 NOTE — Progress Notes (Signed)
Patient is transferred from room 4NP 10 to unit 2W 14 at this time  For emergent intubation due to inability to protect airway. Transferred via bed with all belongings at side.

## 2017-03-17 NOTE — Progress Notes (Signed)
CSW consulted for SNF placement. CSW informed that pt is intubated at this time. Will continue to follow for appropriate placement and for assessment when pt is alert.     Virgie Dad Teryl Gubler, MSW, [facility] Clinical Social Worker [phone]

## 2017-03-17 NOTE — Progress Notes (Signed)
BP improved but still low following fluid bolus, MD aware- received orders for a second 546ml fluid bolus and neosynephrine gtt.

## 2017-03-17 NOTE — Evaluation (Signed)
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 Pt s/p corpectomy C5-6 with C4-7 fusion. PMHx: tobacco use   Clinical Impression   PTA, pt was living alone and was independent. Pt currently requiring Total A +2 for ADLs and bed mobility. Pt with minimal light touch sensation in extremities (UE>LEs) and slight movement for bil shoulder elevation and right elbow flexion and forearm supination. Pt would benefit from further acute OT to facilitate safe dc. Recommend dc to SNF for further acute OT to optimize safety and independence with ADLs.     Follow Up Recommendations  Supervision/Assistance - 24 hour;SNF    Equipment Recommendations  Other (comment)(Defer to next venue)    Recommendations for Other Services PT consult;Speech consult     Precautions / Restrictions Precautions Precautions: Cervical Precaution Booklet Issued: No Required Braces or Orthoses: Cervical Brace Cervical Brace: Hard collar;At all times;Other (comment)(Per [place], [place], [place] note C-collar on at all times) Restrictions Weight Bearing Restrictions: No      Mobility Bed Mobility Overal bed mobility: Needs Assistance Bed Mobility: Rolling;Supine to Sit;Sit to Supine Rolling: Total assist   Supine to sit: Total assist;+2 for physical assistance Sit to supine: Total assist;+2 for physical assistance   General bed mobility comments: Total A for rolling to left side but not tolerating due to pain in right shoulder. Total A +2 for to transitioning between supine and sitting EOB to provide trunk support and bringing BLEs towards EOB. Use of bed pad for turning hips. During supine>sitting, pt initally pushing backwardss and  required cues to flex at hips  Transfers                 General transfer comment: Not safe to attempt at this time    Balance Overall balance assessment: Needs assistance Sitting-balance support: No upper extremity supported;Feet supported Sitting balance-Leahy Scale: Zero Sitting balance - Comments: Requiring total A for sitting balance                                   ADL either performed or assessed with clinical judgement   ADL Overall ADL's : Needs assistance/impaired                                       General ADL Comments: Pt requiring total A for ADLs and bed mobility. Pt performing oral care with Total A. Requiring Total A for bed mobility supeine<>sitting EOB.      Vision         Perception     Praxis      Pertinent Vitals/Pain Pain Assessment: Faces Faces Pain Scale: Hurts even more Pain Location: right shoulder Pain Descriptors / Indicators: Discomfort;Grimacing Pain Intervention(s): Monitored during session;Limited activity within patient's tolerance;Repositioned     Hand Dominance Right   Extremity/Trunk Assessment Upper Extremity Assessment Upper Extremity Assessment: RUE deficits/detail;LUE deficits/detail RUE Deficits / Details: No grasp. Initiation of forearm supination. Slight elbow flexion in non-gravity plane. Performing shoulder elevation.   Reports light tough sensation. RUE Coordination: decreased fine motor;decreased gross motor LUE Deficits / Details: No active movement in hand,  wrist, elbow. Able to perform shoulder elevation. Reports light tough sensation. LUE Coordination: decreased fine motor;decreased gross motor   Lower Extremity Assessment Lower Extremity Assessment: Defer to PT evaluation   Cervical / Trunk Assessment Cervical / Trunk Assessment: Other exceptions Cervical / Trunk Exceptions: cervical fx   Communication Communication Communication: No difficulties;Other (comment)(Soft  spoken)   Cognition Arousal/Alertness: Awake/alert Behavior During Therapy: WFL for tasks assessed/performed Overall Cognitive Status: Within Functional Limits for tasks assessed                                 General Comments: WFL for tasks completed   General Comments  BP stable. SpO2 dropping to 88 on 2.5 L O2    Exercises     Shoulder Instructions      Home Living Family/patient expects to be discharged to:: Private residence Living Arrangements: Alone Available Help at Discharge: Friend(s);Available PRN/intermittently(sister in [place] and neighbor) Type of Home: Apartment Home Access: Stairs to enter     Home Layout: Multi-level(Split level) Alternate Level Stairs-Number of Steps: split level                        Prior Functioning/Environment Level of Independence: Independent        Comments: ADLs and IADLs. Enjoys cooking        OT Problem List: Decreased strength;Decreased range of motion;Decreased activity tolerance;Impaired balance (sitting and/or standing);Decreased safety awareness;Decreased knowledge of use of DME or AE;Decreased knowledge of precautions;Impaired UE functional use;Pain      OT Treatment/Interventions: Self-care/ADL training;Therapeutic exercise;Energy conservation;DME and/or AE instruction;Therapeutic activities;Balance training;Patient/family education    OT Goals(Current goals can be found in the care plan section) Acute Rehab OT Goals Patient Stated Goal: Get my health back OT Goal Formulation: With patient Time For Goal Achievement: 03/31/17 Potential to Achieve Goals: Good ADL Goals Pt Will Perform Grooming: with mod assist;sitting(supported sitting) Pt Will Perform Upper Body Dressing: with mod assist;sitting(supported sitting) Pt Will Transfer to Toilet: with max assist;with +2 assist;stand pivot transfer;bedside commode Additional ADL Goal #1: Pt will perform bed mobility with Mod A +2 in preparation for  ADLs in sitting Additional ADL Goal #2: Pt will maintain sitting for 5 minutes with Min A in preparation for ADLs in sitting  OT Frequency: Min 3X/week   Barriers to D/C: Decreased caregiver support  Lives alone       Co-evaluation PT/OT/SLP Co-Evaluation/Treatment: Yes Reason for Co-Treatment: Complexity of the patient's impairments (multi-system involvement);For patient/therapist safety   OT goals addressed during session: ADL's and self-care      AM-PAC PT "6 Clicks" Daily Activity     Outcome Measure Help from another person eating meals?: Total Help from another person taking care of personal grooming?: Total Help from another person toileting, which includes using toliet, bedpan, or urinal?: Total Help from another person bathing (including washing, rinsing, drying)?: Total Help from another person to put on and taking off regular upper body clothing?: Total Help from another person to put on and taking off regular lower body clothing?: Total 6 Click Score: 6   End of Session Nurse Communication: Mobility status;Precautions;Other (comment)(Needs air mattress and doughnut pillow for head. )  Activity Tolerance: Patient tolerated treatment well;Patient limited by pain Patient left: in bed;with call bell/phone within reach;with bed alarm set  OT Visit Diagnosis: Muscle weakness (generalized) (M62.81);Pain Pain - Right/Left: Right Pain - part of body: Shoulder(Neck)  Time: 7414-2395 OT Time Calculation (min): 32 min Charges:  OT General Charges $OT Visit: 1 Visit OT Evaluation $OT Eval Moderate Complexity: 1 Mod G-Codes:     Willman Cuny MSOT, OTR/L Acute Rehab Pager: [phone] Office: [place] 03/17/2017, 10:16 AM

## 2017-03-17 NOTE — Progress Notes (Signed)
Initial Nutrition Assessment  DOCUMENTATION CODES:   Not applicable  INTERVENTION:    Vital AF 1.2 at 35 ml/h (840 ml per day)   Provides 1008 kcal, 63 gm protein, 681 ml free water daily  NUTRITION DIAGNOSIS:   Inadequate oral intake related to inability to eat as evidenced by NPO status.  GOAL:   Patient will meet greater than or equal to 90% of their needs  MONITOR:   Vent status, TF tolerance, Skin, Labs, I & O's  REASON FOR ASSESSMENT:   Ventilator, Consult Enteral/tube feeding initiation and management  ASSESSMENT:   77 yo female with PMH of chronic neck and back pain, suspected COPD, tobacco abuse who was admitted on 3/10 with weakness and inability to move any extremity spontaneously. 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 S/P C5 & C6 decompression with plating C4-7 on 3/11. Remained quadriplegic after surgery.  Patient was intubated and transferred to the MICU earlier today. Received MD Consult for TF initiation and management. Patient is currently intubated on ventilator support MV: 7.1 L/min Temp (24hrs), Avg:98 F (36.7 C), Min:97.4 F (36.3 C), Max:98.8 F (37.1 C)  Propofol: none Labs and medications reviewed.   NUTRITION - FOCUSED PHYSICAL EXAM:    Most Recent Value  Orbital Region  No depletion  Upper Arm Region  Unable to assess  Thoracic and Lumbar Region  Unable to assess  Buccal Region  Unable to assess  Temple Region  Mild depletion  Clavicle Bone Region  Unable to assess  Clavicle and Acromion Bone Region  Unable to assess  Scapular Bone Region  Unable to assess  Dorsal Hand  Mild depletion  Patellar Region  No depletion  Anterior Thigh Region  No depletion  Posterior Calf Region  Mild depletion  Edema (RD Assessment)  None  Hair  Reviewed  Eyes  Unable to assess  Mouth  Unable to assess  Skin  Reviewed  Nails  Reviewed       Diet Order:  Diet NPO time  specified Precautions:  No bending, arching, or twisting  EDUCATION NEEDS:   No education needs have been identified at this time  Skin:  Skin Assessment: Skin Integrity Issues: Skin Integrity Issues:: Incisions Incisions: neck  Last BM:  unknown  Height:   Ht Readings from Last 1 Encounters:  03/17/17 5' (1.524 m)    Weight:   Wt Readings from Last 1 Encounters:  03/17/17 95 lb (43.1 kg)    Ideal Body Weight:  45.5 kg  BMI:  Body mass index is 18.55 kg/m.  Estimated Nutritional Needs:   Kcal:  1010  Protein:  65-75 gm  Fluid:  1.2-1.4 L    Molli Barrows, RD, LDN, [place] Pager [phone] After Hours Pager [phone]

## 2017-03-17 NOTE — Progress Notes (Signed)
Pharmacy Antibiotic Note  Alison Fisher is a 77 y.o. female admitted on 03/21/2017 with pneumonia.  Pharmacy has been consulted for ceftriaxone dosing.n Patient required intubation for airway protection due to increasing oxygen requirement WBC elevated at 15.1  Plan: -Ceftriaxone 1 gm IV Q 24 hours -Pharmacy to sign off as no further dosage adjustments necessary   Height: 5' (152.4 cm) Weight: 95 lb (43.1 kg) IBW/kg (Calculated) : 45.5  Temp (24hrs), Avg:98 F (36.7 C), Min:97.4 F (36.3 C), Max:98.8 F (37.1 C)  Recent Labs  Lab 03/13/2017 1630 03/12/2017 1635 03/17/17 0340  WBC 15.7*  --  15.1*  CREATININE 0.43* 0.50 0.52    Estimated Creatinine Clearance: 39.4 mL/min (by C-G formula based on SCr of 0.52 mg/dL).    Allergies  Allergen Reactions  . Penicillins Hives    No other information available 04/03/2017 Has patient had a PCN reaction causing immediate rash, facial/tongue/throat swelling, SOB or lightheadedness with hypotension: Unknown Has patient had a PCN reaction causing severe rash involving mucus membranes or skin necrosis: Unknown Has patient had a PCN reaction that required hospitalization: Unknown Has patient had a PCN reaction occurring within the last 10 years: Unknown If all of the above answers are "NO", then may proceed with Cephalosporin use.  . Prednisone Swelling    Thank you for allowing pharmacy to be a part of this patient's care.  Albertina Parr, PharmD., BCPS Clinical Pharmacist Clinical phone for 03/17/17 until 3:30pm: [phone] If after 3:30pm, please call main pharmacy at: [phone]

## 2017-03-17 NOTE — Progress Notes (Signed)
Pts BP 77/55 but did receive bolus dose of 25 mcg of Fentanyl (per PRN order) recently. Dr. Lamonte Sakai informed. No verbal orders received. Fentanyl drip rate decreased and will continue to monitor patient and be in contact with MD regarding blood pressure changes.

## 2017-03-17 NOTE — Progress Notes (Signed)
Notified Dr. Lucile Shutters w/ [facility] of pt's persistent low BP. Fentanyl gtt has been off for >30 mins, NS @75  has been restarted. Received orders for a 524ml bolus NS.  Will continue to monitor.

## 2017-03-17 NOTE — Evaluation (Signed)
Clinical/Bedside Swallow Evaluation Patient Details  Name: Alison Fisher MRN: [id number] Date of Birth: March 20, 1940  Today's Date: 03/17/2017 Time:        Past Medical History:  Past Medical History:  Diagnosis Date  . Chronic neck and back pain    Past Surgical History: History reviewed. No pertinent surgical history. HPI:  77 year old female with past medical history of ongoing tobacco abuse and suspected COPD who for the past month has been having worsening neck pain and arm weakness which was initially thought to perhaps be a pinched nerve and was started on steroids for this and brought to the emergency room after she was unable to get up off the floor after slipping off of the couch. 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 Patient is found to have nearly complete quadriplegia upon arrival to the emergency room. Underwent C5 and C6 corpectomy for decompression with plating spanning C4-C7. Remains quadriplegic. Keep Aspen C collar on at all times. MD notes Acute respiratory failure with hypoxia: Suspect COPD exacerbation. Less likely aspiration pneumonia although one may make that arrangement given her cervical cord issues, however no white count, no fever and no evidence of pneumonia seen on CT scan.    Assessment / Plan / Recommendation Clinical Impression   Pt demonstrates inability to manage secretions, likely due to difficulty swallowing following ACDF with edema and soft tissue impingement of the cervical esophagus.  Cough strength is severely weak, pt cannot expectorate secretions, suspect probable neuromuscular impairment of the cough mechanism. Pt is gurgling on pharyngeal secretions, does not mobilize with cues or with attempts to dry swallow. Hyolaryngeal elevation and excursion is limited subjectively. No PO given. RR in high 20s low 30s with O2 saturations in the mid  80s. Called MD due to concern for airway protection. Will follow for further needs as pt stabilizes, will likely need objective swallowing test in the future.       Aspiration Risk    High   Diet Recommendation   NPO  Medication Administration: Via alternative means    Other  Recommendations     Follow up Recommendations   SNF     Frequency and Duration            Prognosis        Swallow Study   General HPI: 77 year old female with past medical history of ongoing tobacco abuse and suspected COPD who for the past month has been having worsening neck pain and arm weakness which was initially thought to perhaps be a pinched nerve and was started on steroids for this and brought to the emergency room after she was unable to get up off the floor after slipping off of the couch. 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 Patient is found to have nearly complete quadriplegia upon arrival to the emergency room. Underwent C5 and C6 corpectomy for decompression with plating spanning C4-C7. Remains quadriplegic. Keep Aspen C collar on at all times. MD notes Acute respiratory failure with hypoxia: Suspect COPD exacerbation. Less likely aspiration pneumonia although one may make that arrangement given her cervical cord issues, however no white count, no fever and no evidence of pneumonia seen on CT scan.  Type of Study: Bedside Swallow Evaluation Oral Cavity - Dentition: Adequate natural dentition    Oral/Motor/Sensory  Function     Ice Chips     Thin Liquid      Nectar Thick     Honey Thick     Puree     Solid   GO           Herbie Baltimore, MA CCC-SLP [phone]  Alison Fisher 03/17/2017,11:05 AM

## 2017-03-17 NOTE — Progress Notes (Signed)
Neurosurgery Progress Note  No issues overnight. Neck soreness as expected Continues to have diffuse body "numbness"  EXAM:  BP 106/61   Pulse 86   Temp 98.8 F (37.1 C)   Resp 19   SpO2 93%   In Aspen C collar Awake, alert, oriented  Speech fluent, appropriate  Remains quadriplegic Incision c/d/i, flat. No hematoma.  IMPRESSION/PLAN 77 y.o. female POD #1 from C5 and C6 corpectomy for decompression with plating spanning C4-C7. Remains quadriplegic. Discussed prognosis for recovery. Work with therapy today as able. Keep Aspen C collar on at all times. Continue medical management per primary team.

## 2017-03-17 NOTE — Progress Notes (Signed)
PROGRESS NOTE  Marianne Golightly ZOX:[id number] DOB: [birth year] DOA: 04/02/2017 PCP: Patient, No Pcp Per  HPI/Recap of past 11 hours: 77 year old female with past medical history of ongoing tobacco abuse and suspected COPD who for the past month has been having worsening neck pain and arm weakness which was initially thought to perhaps be a pinched nerve and was started on steroids for this and brought  to the emergency room on the evening of 3/10 after she was unable to get up off the floor after slipping off of the couch.  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  Admitted to the hospitalist service.  Case discussed with neurosurgery who took patient to the operating room on the afternoon of 3/11.  Also incidentally noted to be hypoxic with requiring 2 L nasal cannula continuously.  Patient underwent cervical fusion and stabilization.  Much of penetrating tumor removed with pathology pending.  Patient's quadriplegia remains with minimal improvement.  She was noted today to require more more oxygen barely keeping oxygen saturations above 90%.  Patient was felt to be aspirating on her own secretions.  Critical care and myself met with patient and after discussion, patient relented and was transferred to medical ICU and intubated for airway protection.  She is currently on a ventilator.    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  Does not appear to be mature metastatic as full body skin notes no other solid mass lesions.  Suspicious for lymphoma or plasmacytoma.  Pathology pending.  Neuro ICU  following surgery.  Acute respiratory failure with hypoxia now on ventilator support: Initially suspected COPD exacerbation.  Given previous tobacco abuse which is ongoing.  Initially attempted to treat with oxygen and nebulizers.  Less likely aspiration pneumonia although one may make that arrangement given her cervical cord issues, however no white count, no fever and no evidence of pneumonia seen on CT scan.  Now with airway protection, likely swallowing and coughing ability impaired secondary to surgery.  Followed by critical care Active Problems: ?  Protein calorie malnutrition: Nutrition will follow-up once patient extubated   Code Status: Full code  Family Communication: Left message for sister who lives in [place] initially.  I have not updated her about her sister being intubated.  Patient specifically told me not to call her  Disposition Plan: Dependent upon pathology   Consultants:  Neurosurgery  Critical care-now attending physician  Procedures:  Status post cervical fusion by neurosurgery/tumor removal  Status post intubation on ventilator  Antimicrobials:  None  DVT prophylaxis: SCDs   Objective: Vitals:   03/17/17 1234 03/17/17 1255 03/17/17 1300 03/17/17 1445  BP:   138/78   Pulse:   99   Resp:   18   Temp:      TempSrc:      SpO2: (!) 86% 100% 100% 100%  Weight:      Height:        Intake/Output Summary (Last 24 hours) at 03/17/2017 1543 Last data filed at 03/17/2017 1300 Gross per 24 hour  Intake 2501.33 ml  Output 725 ml  Net 1776.33 ml   Filed Weights   03/17/17 0835  Weight: 43.1  kg (95 lb)   Body mass index is 18.55 kg/m.  Exam:   General: Alert and oriented x3, fatigued, poor coughing effort  HEENT: Normocephalic, atraumatic, mucous members dry  Neck: Supple, no JVD, tender  Cardiovascular: Regular rate and rhythm, S1-S2  Respiratory: Rhonchorous  Abdomen: Soft, nontender, nondistended, positive bowel  sounds  Musculoskeletal: No clubbing or cyanosis, trace pitting edema  Neuro: Decreased grip, flexion and extension upper and lower extremities 1-/5 to 0  Skin: No skin breaks, tears or lesions  Psychiatry: Appropriate, no evidence of psychosis   Data Reviewed: CBC: Recent Labs  Lab 03/10/2017 1630 03/20/2017 1635 03/17/17 0340  WBC 15.7*  --  15.1*  NEUTROABS 13.1*  --   --   HGB 16.4* 17.3* 12.4  HCT 48.4* 51.0* 38.7  MCV 100.4*  --  102.1*  PLT 315  --  937   Basic Metabolic Panel: Recent Labs  Lab 03/20/2017 1630 03/07/2017 1635 03/17/17 0340  NA 134* 134* 139  K 3.9 4.0 4.1  CL 92* 92* 103  CO2 30  --  29  GLUCOSE 91 90 104*  BUN _0 CREATININE 0.43* 0.50 0.52  CALCIUM 9.1  --  8.4*   GFR: Estimated Creatinine Clearance: 39.4 mL/min (by C-G formula based on SCr of 0.52 mg/dL). Liver Function Tests: Recent Labs  Lab 03/26/2017 1630  AST 30  ALT 30  ALKPHOS 69  BILITOT 1.3*  PROT 6.8  ALBUMIN 3.7   No results for input(s): LIPASE, AMYLASE in the last 168 hours. No results for input(s): AMMONIA in the last 168 hours. Coagulation Profile: Recent Labs  Lab 03/31/2017 1630 03/17/17 0340  INR 0.89 0.96   Cardiac Enzymes: Recent Labs  Lab 03/08/2017 1630  CKTOTAL 222  TROPONINI <0.03   BNP (last 3 results) No results for input(s): PROBNP in the last 8760 hours. HbA1C: No results for input(s): HGBA1C in the last 72 hours. CBG: Recent Labs  Lab 03/25/2017 0207  GLUCAP 90   Lipid Profile: No results for input(s): CHOL, HDL, LDLCALC, TRIG, CHOLHDL, LDLDIRECT in the last 72 hours. Thyroid Function Tests: No results for input(s): TSH, T4TOTAL, FREET4, T3FREE, THYROIDAB in the last 72 hours. Anemia Panel: No results for input(s): VITAMINB12, FOLATE, FERRITIN, TIBC, IRON, RETICCTPCT in the last 72 hours. Urine analysis:    Component Value Date/Time   COLORURINE YELLOW 04/05/2017 1713   APPEARANCEUR HAZY (A) 03/13/2017 1713   LABSPEC 1.013  03/09/2017 1713   PHURINE 7.0 03/09/2017 1713   GLUCOSEU NEGATIVE 04/01/2017 1713   HGBUR NEGATIVE 03/29/2017 1713   BILIRUBINUR NEGATIVE 03/18/2017 1713   KETONESUR 5 (A) 03/13/2017 1713   PROTEINUR NEGATIVE 03/29/2017 1713   NITRITE NEGATIVE 03/26/2017 1713   LEUKOCYTESUR NEGATIVE 03/07/2017 1713   Sepsis Labs: _1 (procalcitonin:4,lacticidven:4)  ) Recent Results (from the past 240 hour(s))  Surgical pcr screen     Status: None   Collection Time: 03/21/2017  8:22 AM  Result Value Ref Range Status   MRSA, PCR NEGATIVE NEGATIVE Final   Staphylococcus aureus NEGATIVE NEGATIVE Final    Comment: (NOTE) The Xpert SA Assay (FDA approved for NASAL specimens in patients 50 years of age and older), is one component of a comprehensive surveillance program. It is not intended to diagnose infection nor to guide or monitor treatment. Performed at [facility], [place] [address], [place], [place] [address]       Studies: Dg Cervical Spine 2-3 Views  Result Date: 03/10/2017 CLINICAL DATA:  Tumor  of the cervical spine compressing the cervical spinal cord. Pathologic destruction the C5 and C6 vertebral bodies. EXAM: DG C-ARM 61-120 MIN; CERVICAL SPINE - 2-3 VIEW COMPARISON:  CT scan MRI dated 03/13/2017 FINDINGS: AP and lateral views of the cervical spine demonstrate the patient has undergone corpectomy at C5 and C6 with anterior fusion from C4 to C7. Titanium expandable cage placed. Hardware appears in good position in the AP and lateral projections. IMPRESSION: Corpectomy with anterior fusion from C4-C7 with cage insertion. FLUOROSCOPY TIME:  C-arm fluoroscopic images were obtained intraoperatively and submitted for post operative interpretation. Please see the performing provider's procedural report for the fluoroscopy time utilized. Electronically Signed   By: Lorriane Shire M.D.   On: 04/04/2017 17:45   Dg Chest Portable 1 View  Result Date: 03/17/2017 CLINICAL DATA:  Endotracheal  tube placement.  OG tube placement. EXAM: PORTABLE CHEST 1 VIEW COMPARISON:  CT 03/23/2017. FINDINGS: Endotracheal tube tip 5 cm above the carina. Orogastric tube appears to be coiled in the stomach with its tip coiled back into the lower esophagus, repositioning suggested. Atelectasis left lower lobe with left-sided pleural effusion. No pneumothorax. Prior cervical spine fusion. Thoracic spine degenerative change. IMPRESSION: 1.  Endotracheal tube tip noted 5 cm above the carina. 2. Orogastric tube noted coiled in the stomach with its distal tip coiled back into the lower esophagus. Repositioning suggested. 3.  Left lower lobe atelectasis with left-sided pleural effusion. Critical Value/emergent results were called by telephone at the time of interpretation on 03/17/2017 at 1:19 pm to nurse Threasa Beards, who verbally acknowledged these results. Electronically Signed   By: Marcello Moores  Register   On: 03/17/2017 13:20   Dg Abd Portable 1v  Result Date: 03/17/2017 CLINICAL DATA:  Orogastric tube placement EXAM: PORTABLE ABDOMEN - 1 VIEW COMPARISON:  03/17/2017 FINDINGS: Gastric tube has been adjusted with the tip now in the body the stomach. Bowel decompressed without obstruction or ileus Left lower lobe consolidation and left effusion IMPRESSION: Gastric tube now in the body the stomach.  Normal bowel gas pattern Left lower lobe consolidation. Electronically Signed   By: Franchot Gallo M.D.   On: 03/17/2017 13:49   Dg Abd Portable 1v  Result Date: 03/17/2017 CLINICAL DATA:  Orogastric tube placement. EXAM: PORTABLE ABDOMEN - 1 VIEW COMPARISON:  None. FINDINGS: The bowel gas pattern is normal. No radio-opaque calculi or other significant radiographic abnormality are seen. Orogastric tube is seen entering stomach, but tip looping back into the distal esophagus. IMPRESSION: Orogastric tube is seen looped within proximal stomach, but distal tip is directed back into the distal esophagus. Electronically Signed   By: Marijo Conception, M.D.   On: 03/17/2017 13:20   Dg C-arm 1-60 Min  Result Date: 04/03/2017 CLINICAL DATA:  Tumor of the cervical spine compressing the cervical spinal cord. Pathologic destruction the C5 and C6 vertebral bodies. EXAM: DG C-ARM 61-120 MIN; CERVICAL SPINE - 2-3 VIEW COMPARISON:  CT scan MRI dated 04/04/2017 FINDINGS: AP and lateral views of the cervical spine demonstrate the patient has undergone corpectomy at C5 and C6 with anterior fusion from C4 to C7. Titanium expandable cage placed. Hardware appears in good position in the AP and lateral projections. IMPRESSION: Corpectomy with anterior fusion from C4-C7 with cage insertion. FLUOROSCOPY TIME:  C-arm fluoroscopic images were obtained intraoperatively and submitted for post operative interpretation. Please see the performing provider's procedural report for the fluoroscopy time utilized. Electronically Signed   By: Lorriane Shire M.D.   On: 03/06/2017 17:45  Scheduled Meds: . chlorhexidine gluconate (MEDLINE KIT)  15 mL Mouth Rinse BID  . dexamethasone  4 mg Intravenous Q6H  . famotidine  20 mg Per Tube BID  . feeding supplement (VITAL AF 1.2 CAL)  1,000 mL Per Tube Q24H  . fentaNYL (SUBLIMAZE) injection  50 mcg Intravenous Once  . mouth rinse  15 mL Mouth Rinse QID    Continuous Infusions: . sodium chloride 75 mL/hr at 03/19/2017 1844  . cefTRIAXone (ROCEPHIN)  IV Stopped (03/17/17 1418)  . fentaNYL infusion INTRAVENOUS 50 mcg/hr (03/17/17 1244)  . methocarbamol (ROBAXIN)  IV       LOS: 2 days     Annita Brod, MD [facility]  To reach me or the doctor on call, go to: [email or web address] Password [facility]  03/17/2017, 3:43 PM

## 2017-03-17 NOTE — Progress Notes (Signed)
RT note- Patient emergently intubated with possible difficult airway. All emergent equipment at the bedside. Patient was bronched at this time as well. ETT confirmed with bronch at 32. ABG pending.

## 2017-03-17 NOTE — Evaluation (Signed)
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 Pt s/p corpectomy C5-6 with C4-7 fusion. PMHx: tobacco use  Clinical Impression  Pt very pleasant, soft spoken with audible secretions but unable to cough. Pt with bil LE withdrawal to noxious stimuli but unable to move bil LE on command. Pt with decreased sensation from grossly T8 distally with report of no sensation other than very light touch to bil feet. Pt with decreased strength, ROM, function, balance and will benefit from acute therapy to maximize mobility, function, safety and activity tolerance.      Follow Up Recommendations Supervision/Assistance - 24 hour;SNF    Equipment Recommendations  Wheelchair (measurements PT);Hospital bed;Wheelchair cushion (measurements PT)    Recommendations for Other Services       Precautions / Restrictions Precautions Precautions: Cervical;Fall Precaution Booklet Issued: No Required Braces or Orthoses: Cervical Brace Cervical Brace: Hard collar;At all times Restrictions Weight Bearing Restrictions: No      Mobility  Bed Mobility Overal bed mobility: Needs Assistance Bed Mobility: Rolling;Supine to Sit;Sit to Supine Rolling: Total assist   Supine to sit: Total assist;+2 for physical assistance Sit to supine: Total assist;+2 for physical assistance   General bed mobility comments: Total A for rolling to left side but not tolerating due to pain in right shoulder. Total A +2 for to transitioning between supine and sitting EOB to provide trunk support and bringing BLEs towards EOB. Helicopter technique with pad to pivot trunk and pelvis, pt initally  pushing backwards with transition to sitting and required cues to flex at hips  Transfers                 General transfer comment: Not safe to attempt at this time  Ambulation/Gait                Stairs            Wheelchair Mobility    Modified Rankin (Stroke Patients Only)       Balance Overall balance assessment: Needs assistance Sitting-balance support: No upper extremity supported;Feet supported Sitting balance-Leahy Scale: Zero Sitting balance - Comments: Requiring total A for sitting balance pt with no trunk activation with cues for anterior translation with posterior lean not supported                                     Pertinent Vitals/Pain Pain Assessment: Faces Faces Pain Scale: Hurts even more Pain Location: right shoulder with sitting and rolling left Pain Descriptors / Indicators: Discomfort;Grimacing Pain Intervention(s): Limited activity within patient's tolerance;Repositioned;Monitored during session    Home Living Family/patient expects to be discharged to:: Private residence Living Arrangements: Alone Available Help at Discharge: Friend(s);Available PRN/intermittently Type of Home: House Home Access: Stairs to enter   Entrance Stairs-Number of Steps: 7 Home Layout: Multi-level;Bed/bath upstairs Home Equipment: None      Prior Function Level of Independence: Independent         Comments: ADLs and IADLs. Enjoys cooking     Hand Dominance   Dominant Hand: Right    Extremity/Trunk Assessment   Upper Extremity Assessment Upper Extremity Assessment: Defer  to OT evaluation RUE Deficits / Details: No grasp. Initiation of forearm supination. Slight elbow flexion in non-gravity plane. Performing shoulder elevation.   Reports light tough sensation. RUE Coordination: decreased fine motor;decreased gross motor LUE Deficits / Details: No active movement in hand, wrist, elbow. Able to perform shoulder elevation.  Reports light tough sensation. LUE Coordination: decreased fine motor;decreased gross motor    Lower Extremity Assessment Lower Extremity Assessment: RLE deficits/detail;LLE deficits/detail RLE Deficits / Details: pt with dorsiflexion and plantarflexion as well as 2/5 knee flexion all with reflexive withdrawal to noxious stimuli of pain and tickling foot. no AROM on command, light sensation to bil feet no response of touch to grossly T10 to calf RLE Sensation: decreased light touch RLE Coordination: decreased gross motor;decreased fine motor LLE Deficits / Details: pt with dorsiflexion and plantarflexion as well as 2/5 knee flexion all with reflexive withdrawal to noxious stimuli of pain and tickling foot. no AROM on command, light sensation to bil feet no response of touch to grossly T10 to calf LLE Sensation: decreased light touch LLE Coordination: decreased fine motor;decreased gross motor    Cervical / Trunk Assessment Cervical / Trunk Assessment: Other exceptions Cervical / Trunk Exceptions: cervical fx with collar  Communication   Communication: No difficulties  Cognition Arousal/Alertness: Awake/alert Behavior During Therapy: WFL for tasks assessed/performed Overall Cognitive Status: Within Functional Limits for tasks assessed                                 General Comments: WFL for tasks completed      General Comments General comments (skin integrity, edema, etc.): BP 114/54 supine 115/64 sitting EOB, 119/55 in bed in chair position  Noted small pink area to posterior scalp with rN made aware    Exercises     Assessment/Plan    PT Assessment Patient needs continued PT services  PT Problem List Decreased strength;Decreased mobility;Decreased safety awareness;Decreased range of motion;Decreased coordination;Decreased activity tolerance;Cardiopulmonary status limiting activity;Decreased skin integrity;Decreased balance;Decreased knowledge of use of  DME;Impaired sensation       PT Treatment Interventions DME instruction;Therapeutic activities;Cognitive remediation;Therapeutic exercise;Patient/family education;Balance training;Functional mobility training;Neuromuscular re-education    PT Goals (Current goals can be found in the Care Plan section)  Acute Rehab PT Goals Patient Stated Goal: Get my health back PT Goal Formulation: With patient Time For Goal Achievement: 03/31/17 Potential to Achieve Goals: Fair    Frequency Min 3X/week   Barriers to discharge Decreased caregiver support      Co-evaluation PT/OT/SLP Co-Evaluation/Treatment: Yes Reason for Co-Treatment: Complexity of the patient's impairments (multi-system involvement);For patient/therapist safety PT goals addressed during session: Mobility/safety with mobility;Balance OT goals addressed during session: ADL's and self-care       AM-PAC PT "6 Clicks" Daily Activity  Outcome Measure Difficulty turning over in bed (including adjusting bedclothes, sheets and blankets)?: Unable Difficulty moving from lying on back to sitting on the side of the bed? : Unable Difficulty sitting down on and standing up from a chair with arms (e.g., wheelchair, bedside commode, etc,.)?: Unable Help needed moving to and from a bed to chair (including a wheelchair)?: Total Help needed walking in hospital room?: Total Help needed climbing 3-5 steps with a railing? : Total 6 Click Score: 6    End of Session Equipment Utilized During Treatment: Oxygen Activity Tolerance: Patient tolerated treatment well Patient left: in bed;with call bell/phone within reach;Other (comment)(in semi chair position with soft touch) Nurse  Communication: Mobility status;Other (comment);Precautions(need for air mattress, pressure relief, donut cushion for head, monitoring of respiratory status with secretions and no cough) PT Visit Diagnosis: Other abnormalities of gait and mobility (R26.89);Muscle weakness  (generalized) (M62.81);Other symptoms and signs involving the nervous system (G83.662)    Time: 9476-5465 PT Time Calculation (min) (ACUTE ONLY): 31 min   Charges:   PT Evaluation $PT Eval Moderate Complexity: 1 Mod     PT G Codes:        Elwyn Reach, PT [phone]   [place] [facility] 03/17/2017, 11:01 AM

## 2017-03-17 NOTE — Progress Notes (Signed)
Peripherally Inserted Central Catheter/Midline Placement  The IV Nurse has discussed with the patient and/or persons authorized to consent for the patient, the purpose of this procedure and the potential benefits and risks involved with this procedure.  The benefits include less needle sticks, lab draws from the catheter, and the patient may be discharged home with the catheter. Risks include, but not limited to, infection, bleeding, blood clot (thrombus formation), and puncture of an artery; nerve damage and irregular heartbeat and possibility to perform a PICC exchange if needed/ordered by physician.  Alternatives to this procedure were also discussed.  Bard Power PICC patient education guide, fact sheet on infection prevention and patient information card has been provided to patient /or left at bedside.    PICC/Midline Placement Documentation  PICC Double Lumen 03/17/17 PICC Right Brachial 34 cm 0 cm (Active)  Indication for Insertion or Continuance of Line Prolonged intravenous therapies 03/17/2017  5:20 PM  Exposed Catheter (cm) 0 cm 03/17/2017  5:20 PM  Site Assessment Clean;Dry;Intact 03/17/2017  5:20 PM  Lumen #1 Status Flushed;Saline locked;Blood return noted 03/17/2017  5:20 PM  Lumen #2 Status Flushed;Saline locked;Blood return noted 03/17/2017  5:20 PM  Dressing Type Transparent;Securing device 03/17/2017  5:20 PM  Dressing Status Clean;Dry;Intact;Antimicrobial disc in place 03/17/2017  5:20 PM  Dressing Change Due 2017-04-11 03/17/2017  5:20 PM       Frances Maywood 03/17/2017, 5:46 PM

## 2017-03-17 NOTE — Procedures (Signed)
Intubation Procedure Note Meenakshi Sazama [id number] 02/07/1940  Procedure: Intubation Indications: Airway protection and maintenance  Procedure Details Consent: Risks of procedure as well as the alternatives and risks of each were explained to the (patient/caregiver).  Consent for procedure obtained. Time Out: Verified patient identification, verified procedure, site/side was marked, verified correct patient position, special equipment/implants available, medications/allergies/relevent history reviewed, required imaging and test results available.  Performed  Maximum sterile technique was used including gloves, gown, hand hygiene and mask.  MAC    Evaluation Hemodynamic Status: BP stable throughout; O2 sats: stable throughout Patient's Current Condition: stable Complications: No apparent complications Patient did tolerate procedure well. Chest X-ray ordered to verify placement.  CXR: pending.   Jennet Maduro 03/17/2017

## 2017-03-17 NOTE — Progress Notes (Signed)
No issues overnight. Pt has hoarseness of voice  EXAM:  BP 138/78   Pulse 99   Temp 98.8 F (37.1 C) (Oral)   Resp 18   Ht 5' (1.524 m)   Wt 43.1 kg (95 lb)   SpO2 100%   BMI 18.55 kg/m   Significant respiratory secretions she is unable to clear Awake, alert Speech fluent CN grossly intact  1/5 right bicep, 1-2/5 left bicep, otherwise 0/5 throughout Neck soft, no hematoma, wound c/d/i  IMPRESSION:  77 y.o. female POD#1 s/p C5 C6 corpectomy for tumor, essentially C5 quadriplegic. Poor respiratory condition may be related to level of cervical spinal cord injury.  PLAN: - Cont current mgmt - Awaiting pathology from surgical specimen

## 2017-03-17 NOTE — Plan of Care (Signed)
  Progressing Clinical Measurements: Will remain free from infection 03/17/2017 0034 - Progressing by Randal Buba, RN Respiratory complications will improve 03/17/2017 0034 - Progressing by Randal Buba, RN Cardiovascular complication will be avoided 03/17/2017 0034 - Progressing by Randal Buba, RN Pain Managment: General experience of comfort will improve 03/17/2017 0034 - Progressing by Randal Buba, RN Safety: Ability to remain free from injury will improve 03/17/2017 0034 - Progressing by Randal Buba, RN Skin Integrity: Risk for impaired skin integrity will decrease 03/17/2017 0034 - Progressing by Randal Buba, RN

## 2017-03-17 NOTE — Procedures (Signed)
Bronchoscopy Procedure Note Chi Woodham [id number] 06/16/1940  Procedure: Bronchoscopy Indications: Diagnostic evaluation of the airways, Obtain specimens for culture and/or other diagnostic studies and Remove secretions  Procedure Details Consent: Unable to obtain consent because of emergent medical necessity. Time Out: Verified patient identification, verified procedure, site/side was marked, verified correct patient position, special equipment/implants available, medications/allergies/relevent history reviewed, required imaging and test results available.  Performed  In preparation for procedure, patient was given 100% FiO2 and bronchoscope lubricated. Sedation: Benzodiazepines, Muscle relaxants, Etomidate and Fentanyl  Airway entered and the following bronchi were examined: RUL, RML, RLL, LUL, LLL and Bronchi.   Procedures performed: Brushings performed Bronchoscope removed.  , Patient placed back on 100% FiO2 at conclusion of procedure.    Evaluation Hemodynamic Status: BP stable throughout; O2 sats: stable throughout Patient's Current Condition: stable Specimens:  Sent purulent fluid Complications: No apparent complications Patient did tolerate procedure well.   Jennet Maduro 03/17/2017

## 2017-03-17 NOTE — Consult Note (Signed)
PULMONARY / CRITICAL CARE MEDICINE   Name: Alison Fisher MRN: [id number] DOB: Dec 29, 1940    ADMISSION DATE:  03/17/2017 CONSULTATION DATE:  03/17/2017  REFERRING MD:  Dr. Maryland Pink  CHIEF COMPLAINT:  Weakness  HISTORY OF PRESENT ILLNESS:   77 year old female with PMH of tobacco abuse, suspected COPD, and chronic neck and back pain admitted to [facility] on 3/10 with weakness.    Patient with progressive neck and right arm weakness thought to be cervical radiculopathy treated with steroids.  Presented on 3/10 after she slipped off the couch and was unable to get up.  On arrival to [facility], she was unable to move any extremity spontaneously with diffuse numbess.  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  No known prior history of cancer. She was transferred to [facility] for Neurosurgery evaluation.  MRI confirmed CT findings with ventral epidural mass and C5 pathologic fracture with retropulsion causing spinal stenosis with cord compression treated with decadron and cervical stabilization. CT chest and abdomen raise concern for possible adrenal nodules and thickened endometrium.  She underwent C5 and C6 corpectomy for decompression with plating C4-7 on 3/11.  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  On 3/12, patient with ongoing hypoxia and increased O2 requirements.  SLP evaluation noted that patient was unable to manage any secretions, gurgling, and concerned if patient could protect her airway.  PCCM consulted for possible intubation.   PAST MEDICAL HISTORY :  She  has a past medical history of Chronic neck and back pain.  PAST SURGICAL HISTORY: She  has no past surgical history on file.  Allergies  Allergen Reactions  . Penicillins Hives    No other information available 03/17/2017 Has patient had a PCN reaction causing immediate rash, facial/tongue/throat swelling, SOB or lightheadedness with hypotension:  Unknown Has patient had a PCN reaction causing severe rash involving mucus membranes or skin necrosis: Unknown Has patient had a PCN reaction that required hospitalization: Unknown Has patient had a PCN reaction occurring within the last 10 years: Unknown If all of the above answers are "NO", then may proceed with Cephalosporin use.  . Prednisone Swelling    No current facility-administered medications on file prior to encounter.    Current Outpatient Medications on File Prior to Encounter  Medication Sig  . aspirin EC 81 MG tablet Take 81 mg by mouth daily.  . methocarbamol (ROBAXIN) 500 MG tablet Take 500 mg by mouth 4 (four) times daily. 10 day course filled 03/04/17  . methylPREDNISolone (MEDROL DOSEPAK) 4 MG TBPK tablet Take 4 mg by mouth. 6 day dose pak filled 03/04/17  . predniSONE (DELTASONE) 10 MG tablet Take 20 mg by mouth daily. 7 day course filled 03/10/17  . traMADol (ULTRAM) 50 MG tablet Take 50 mg by mouth every 8 (eight) hours as needed (pain).     FAMILY HISTORY:  Her has no family status information on file.    SOCIAL HISTORY: She  reports that she has been smoking.  she has never used smokeless tobacco. She reports that she drinks about 10.2 oz of alcohol per week. She reports that she does not use drugs.  REVIEW OF SYSTEMS:   Unattainable, patient is very hoarse  SUBJECTIVE:  SOB and some minimal coughing, afraid of intubation  VITAL SIGNS: BP (!) 119/55   Pulse 81   Temp 98.8 F (37.1 C) (Oral)   Resp (!) 21   Ht 4\' 10"  (1.473  m)   Wt 95 lb (43.1 kg)   SpO2 92%   BMI 19.86 kg/m   HEMODYNAMICS:    VENTILATOR SETTINGS:    INTAKE / OUTPUT: I/O last 3 completed shifts: In: 4099.8 [I.V.:3549.8; Other:300; IV Piggyback:250] Out: 6440 [Urine:1450; Blood:200]  PHYSICAL EXAMINATION: General:  Acutely ill appearing female, moderate respiratory distress HEENT: MM pink/moist PSY: Alert and interactive Neuro: Alert and interactive, not moving any  ext CV: s1s2 rrr, no m/r/g PULM: coarse BS diffusely HK:VQQV, non-tender, bsx4 active  Extremities: warm/dry, - edema  Skin: no rashes or lesions  LABS:  BMET Recent Labs  Lab 03/26/2017 1630 03/13/2017 1635 03/17/17 0340  NA 134* 134* 139  K 3.9 4.0 4.1  CL 92* 92* 103  CO2 30  --  29  BUN 16 19 11   CREATININE 0.43* 0.50 0.52  GLUCOSE 91 90 104*    Electrolytes Recent Labs  Lab 03/09/2017 1630 03/17/17 0340  CALCIUM 9.1 8.4*    CBC Recent Labs  Lab 03/17/2017 1630 03/23/2017 1635 03/17/17 0340  WBC 15.7*  --  15.1*  HGB 16.4* 17.3* 12.4  HCT 48.4* 51.0* 38.7  PLT 315  --  262    Coag's Recent Labs  Lab 03/19/2017 1630 03/17/17 0340  APTT  --  28  INR 0.89 0.96    Sepsis Markers No results for input(s): LATICACIDVEN, PROCALCITON, O2SATVEN in the last 168 hours.  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 Pathologic destruction the C5 and C6 vertebral bodies. EXAM: DG C-ARM 61-120 MIN; CERVICAL SPINE - 2-3 VIEW COMPARISON:  CT scan MRI dated 03/13/2017 FINDINGS: AP and lateral views of the cervical spine demonstrate the patient has undergone corpectomy at C5 and C6 with anterior fusion from C4 to C7. Titanium expandable cage placed. Hardware appears in good position in the AP and lateral projections. IMPRESSION: Corpectomy with anterior fusion from C4-C7 with cage insertion. FLUOROSCOPY TIME:  C-arm fluoroscopic images were obtained intraoperatively and submitted for post operative interpretation. Please see the performing provider's  procedural report for the fluoroscopy time utilized. Electronically Signed   By: Lorriane Shire M.D.   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 Pathologic destruction the C5 and C6 vertebral bodies. EXAM: DG C-ARM 61-120 MIN; CERVICAL SPINE - 2-3 VIEW COMPARISON:  CT scan MRI dated 04/01/2017 FINDINGS: AP and lateral views of the cervical spine demonstrate the patient has undergone corpectomy at C5 and C6 with anterior fusion from C4 to C7. Titanium expandable cage placed. Hardware appears in good position in the AP and lateral projections. IMPRESSION: Corpectomy with anterior fusion from C4-C7 with cage insertion. FLUOROSCOPY TIME:  C-arm fluoroscopic images were obtained intraoperatively and submitted for post operative interpretation. Please see the performing provider's procedural report for the fluoroscopy time utilized. Electronically Signed   By: Lorriane Shire M.D.   On: 03/07/2017 17:45   STUDIES:  3/10  Cervical Spine CT >> pathologic fracture of C5 due to a large infiltrative mass that involves C5, C6, extends along the prevertebral soft tissues and encroaches upon the ventral epidural space.  This may reflect metastatic disease or a primary bony neoplasm.  3/10  CT Chest >> no acute intrathoracic, abdominal or pelvic pathology. No definiate malignancy or metastatic disease identified in the chest, abdomen or pelvis.  R lung subpleural nodular densities, likely areas of scarring  3/10  CT ABD/Pelvis >> thickened appearance of endometrium, indeterminate bilateral adrenal nodules 3/10  MRI Cervical Spine >> infiltrative mass in the C4, C5, C6 vertebrae which could be lymphoma, plasmacytoma or metastasis.   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  Large cystic / necrotic component which buldges  into the prevertebral space.  Paravertebral cervical strain.   CULTURES: MRSA PCR 3/11 >> negative   ANTIBIOTICS:   SIGNIFICANT EVENTS: 3/10  Admit with generalized weakness, multiple falls  LINES/TUBES:   DISCUSSION: 77 y/o F admitted 3/10 with generalized weakness and multiple falls. Workup concerning for infiltrative mass in C4, C5, C6 with pathologic fracture in C5,C6.  In addition, there is concern for possible adrenal nodules and thickened endometrium.    ASSESSMENT / PLAN:  PULMONARY A: At Risk Aspiration  P:   Transfer to ICU for planned intubation Emergent intubation for inability to protect her airway, patient ok with short term intubation only  Monitor CXR Pre-oxygenate patient with 6L  Aspiration precautions Assess PCXR post intubation, make decision regarding aspiration coverage  Cultures from trach  CARDIOVASCULAR A:  Hypotension - resolved  P:  ICU monitoring  Tele monitoring IVF resuscitation  RENAL A:   No Acute Issues  P:   Trend BMP/urinary output Replace electrolytes as indicated Avoid nephrotoxic agents, ensure adequate renal perfusion NS @ 82ml/hr   GASTROINTESTINAL A:   Aspiration Risk  P:   NPO  Place OGT  Bowel regimen > colace BID, senokot BID, PRN dulcolax  TF per nutrition  HEMATOLOGIC A:   Leukocytosis  P:  Trend CBC   INFECTIOUS A:   Leukocytosis - ? Reactive  Anemia  P:   Trend fever curve, WBC   ENDOCRINE A:   At Risk Hypo/Hyperglycemia    P:   Trend glucose   NEUROLOGIC A:   Chronic Pain - neck/back  P:   RASS goal: 0 to -1  Continue neurontin 300 mg TID  Hold further vicodin, dilaudid, oxycodone  PRN robaxin  Will transition to PAD protocol for intubation    FAMILY  - Updates: Patient updated at length, ok with short term intubation only, no trach/peg.  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  Had a long discussion with the patient.  After discussion was agreeable to short term intubation only.  On exam, transmitted upper airway sounds and unable to protect her airway with no gag.  I reviewed CT, emphysema noted.  Discussed with TRH-MD.  Will transfer to the ICU and intubate.  Treat with rocephin for aspiration PNA.  Pan culture.  PAD protocol.  Start TF.  PCCM will assume care.  The patient is critically ill with multiple organ systems failure and requires high complexity decision making for assessment and support, frequent evaluation and titration of therapies, application of advanced monitoring technologies and extensive interpretation of multiple databases.   Critical Care Time devoted to patient care services described in this note is  45  Minutes. This time reflects time of care of this signee Dr Jennet Maduro. This critical care time does not reflect procedure time, or teaching time or supervisory time of PA/NP/Med student/Med Resident etc but could involve care discussion time.  Rush Farmer, M.D. [facility] Pulmonary/Critical Care Medicine. Pager: [phone]. After hours pager: [phone].

## 2017-03-18 ENCOUNTER — Inpatient Hospital Stay (HOSPITAL_COMMUNITY): Payer: Medicare Other

## 2017-03-18 LAB — GLUCOSE, CAPILLARY
GLUCOSE-CAPILLARY: 171 mg/dL — AB (ref 65–99)
GLUCOSE-CAPILLARY: 184 mg/dL — AB (ref 65–99)
Glucose-Capillary: 161 mg/dL — ABNORMAL HIGH (ref 65–99)
Glucose-Capillary: 167 mg/dL — ABNORMAL HIGH (ref 65–99)
Glucose-Capillary: 177 mg/dL — ABNORMAL HIGH (ref 65–99)
Glucose-Capillary: 186 mg/dL — ABNORMAL HIGH (ref 65–99)
Glucose-Capillary: 190 mg/dL — ABNORMAL HIGH (ref 65–99)

## 2017-03-18 LAB — CBC
HCT: 35.9 % — ABNORMAL LOW (ref 36.0–46.0)
Hemoglobin: 11.2 g/dL — ABNORMAL LOW (ref 12.0–15.0)
MCH: 32 pg (ref 26.0–34.0)
MCHC: 31.2 g/dL (ref 30.0–36.0)
MCV: 102.6 fL — ABNORMAL HIGH (ref 78.0–100.0)
PLATELETS: 251 10*3/uL (ref 150–400)
RBC: 3.5 MIL/uL — ABNORMAL LOW (ref 3.87–5.11)
RDW: 13.1 % (ref 11.5–15.5)
WBC: 12.8 10*3/uL — AB (ref 4.0–10.5)

## 2017-03-18 LAB — BASIC METABOLIC PANEL
ANION GAP: 8 (ref 5–15)
BUN: 13 mg/dL (ref 6–20)
CALCIUM: 8 mg/dL — AB (ref 8.9–10.3)
CO2: 27 mmol/L (ref 22–32)
Chloride: 105 mmol/L (ref 101–111)
Creatinine, Ser: 0.48 mg/dL (ref 0.44–1.00)
Glucose, Bld: 177 mg/dL — ABNORMAL HIGH (ref 65–99)
Potassium: 3.6 mmol/L (ref 3.5–5.1)
SODIUM: 140 mmol/L (ref 135–145)

## 2017-03-18 LAB — MAGNESIUM: Magnesium: 1.8 mg/dL (ref 1.7–2.4)

## 2017-03-18 LAB — PHOSPHORUS: PHOSPHORUS: 2.1 mg/dL — AB (ref 2.5–4.6)

## 2017-03-18 LAB — CORTISOL: CORTISOL PLASMA: 0.9 ug/dL

## 2017-03-18 MED ORDER — INSULIN ASPART 100 UNIT/ML ~~LOC~~ SOLN: 0.0000 [IU] | Freq: Three times a day (TID) | SUBCUTANEOUS | Status: DC

## 2017-03-18 MED ORDER — INSULIN ASPART 100 UNIT/ML ~~LOC~~ SOLN: 0.0000 [IU] | Freq: Every day | SUBCUTANEOUS | Status: DC

## 2017-03-18 MED ORDER — CHLORHEXIDINE GLUCONATE CLOTH 2 % EX PADS
6.0000 | MEDICATED_PAD | Freq: Every day | CUTANEOUS | Status: DC
  Administered 2017-03-18 – 2017-03-22 (×4): 6 via TOPICAL

## 2017-03-18 NOTE — Progress Notes (Addendum)
Hypotensive last night.   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 Likely has cancer (surgical path pending). Overall I think Mrs. Eyster's prognosis is very poor given her age, quadriplegia, and malignancy.  Her reintubation appears to be more related to pulmonary issues/drive rather than airway compromise from postoperative neck hematoma. Furthermore, I don't think that even if she did have a hematoma at this point we would consider re-operation for evacuation. I therefore do not think re-imaging of her neck is of any benefit.  PLAN: - Cont current mgmt, awaiting path results

## 2017-03-18 NOTE — Progress Notes (Signed)
SLP Cancellation Note  Patient Details Name: Alison Fisher MRN: [id number] DOB: 08-Nov-1940   Cancelled treatment:       Reason Eval/Treat Not Completed: Medical issues which prohibited therapy, pt intubated. Will sign off at this time.    Kentrell Hallahan, Katherene Ponto 03/18/2017, 7:40 AM

## 2017-03-18 NOTE — Progress Notes (Signed)
eLink Physician-Brief Progress Note Patient Name: Alison Fisher DOB: 10-01-1940 MRN: [id number]   Date of Service  03/18/2017  HPI/Events of Note  Post op hypotension treated with fluid bolus and neo infusion. Blood pressure improved but pt bradycardic on Neo.  eICU Interventions  Discontinue Neo. Monitor BP and heart rate. Obtain baseline EKG and early labs. If bradycardia recurs with hypotension will start low dose dopamine.        Kerry Kass Jhoselin Crume 03/18/2017, 12:05 AM

## 2017-03-18 NOTE — Progress Notes (Signed)
PULMONARY / CRITICAL CARE MEDICINE   Name: Alison Fisher MRN: [id number] DOB: 1940/08/29    ADMISSION DATE:  03/22/2017 CONSULTATION DATE:  03/17/2017  REFERRING MD:  Dr. Maryland Pink  CHIEF COMPLAINT:  Weakness  HISTORY OF PRESENT ILLNESS:   77 year old female with PMH of tobacco abuse, suspected COPD, and chronic neck and back pain admitted to [facility] on 3/10 with weakness.    Patient with progressive neck and right arm weakness thought to be cervical radiculopathy treated with steroids.  Presented on 3/10 after she slipped off the couch and was unable to get up.  On arrival to [facility], she was unable to move any extremity spontaneously with diffuse numbess.  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  No known prior history of cancer. She was transferred to [facility] for Neurosurgery evaluation.  MRI confirmed CT findings with ventral epidural mass and C5 pathologic fracture with retropulsion causing spinal stenosis with cord compression treated with decadron and cervical stabilization. CT chest and abdomen raise concern for possible adrenal nodules and thickened endometrium.  She underwent C5 and C6 corpectomy for decompression with plating C4-7 on 3/11.  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  On 3/12, patient with ongoing hypoxia and increased O2 requirements.  SLP evaluation noted that patient was unable to manage any secretions, gurgling, and concerned if patient could protect her airway.  PCCM consulted for possible intubation.     SUBJECTIVE:  Sedated on the vent no acute distress  VITAL SIGNS: BP (!) 108/51   Pulse 62   Temp 98.4 F (36.9 C) (Oral)   Resp 17   Ht 5' (1.524 m)   Wt 42.1 kg (92 lb 13 oz)   SpO2 97%   BMI 18.13 kg/m   HEMODYNAMICS: CVP:  [5 mmHg-10 mmHg] 5 mmHg  VENTILATOR SETTINGS: Vent Mode: PRVC FiO2 (%):  [40 %-100 %] 40 % Set Rate:  [8 bmp-20 bmp] 16 bmp Vt Set:  [350  mL-360 mL] 350 mL PEEP:  [8 cmH20-10 cmH20] 8 cmH20 Plateau Pressure:  [16 cmH20-18 cmH20] 18 cmH20  INTAKE / OUTPUT: I/O last 3 completed shifts: In: 4273.9 [I.V.:3693.3; NG/GT:480.7; IV Piggyback:100] Out: 857 [Urine:857]  PHYSICAL EXAMINATION: General: Frail elderly female on vent HEENT: Color place, anterior cervical surgery site intact endotracheal tube to ventilator PSY: Sedated Neuro: Quadriparetic with very little movement of lower and upper extremities CV: Heart sounds are regular PULM: even/non-labored, lungs bilaterally crease in bases YQ:MVHQ, non-tender, bsx4 active  Extremities: warm/dry, 9 edema  Skin: no rashes or lesions   LABS:  BMET Recent Labs  Lab 03/23/2017 1630 03/30/2017 1635 03/17/17 0340 03/18/17 0010  NA 134* 134* 139 140  K 3.9 4.0 4.1 3.6  CL 92* 92* 103 105  CO2 30  --  29 27  BUN 16 19 11 13   CREATININE 0.43* 0.50 0.52 0.48  GLUCOSE 91 90 104* 177*    Electrolytes Recent Labs  Lab 03/23/2017 1630 03/17/17 0340 03/18/17 0010  CALCIUM 9.1 8.4* 8.0*  MG  --   --  1.8  PHOS  --   --  2.1*    CBC Recent Labs  Lab 04/04/2017 1630 03/12/2017 1635 03/17/17 0340 03/18/17 0010  WBC 15.7*  --  15.1* 12.8*  HGB 16.4* 17.3* 12.4 11.2*  HCT 48.4* 51.0* 38.7 35.9*  PLT 315  --  262 251    Coag's Recent Labs  Lab 03/22/2017 1630 03/17/17 0340  APTT  --  28  INR 0.89 0.96    Sepsis Markers No results for input(s): LATICACIDVEN, PROCALCITON, O2SATVEN in the last 168 hours.  ABG Recent Labs  Lab 03/09/2017 2200 03/17/17 1253  PHART 7.387 7.402  PCO2ART 46.2 49.5*  PO2ART 113.0* 159.0*    Liver Enzymes Recent Labs  Lab 03/23/2017 1630  AST 30  ALT 30  ALKPHOS 69  BILITOT 1.3*  ALBUMIN 3.7    Cardiac Enzymes Recent Labs  Lab 03/13/2017 1630  TROPONINI <0.03    Glucose Recent Labs  Lab 03/08/2017 0207 03/17/17 1542 03/17/17 2030 03/18/17 0007 03/18/17 0306 03/18/17 0856  GLUCAP 90 100* 122* 161* 171* 184*     Imaging Dg Chest Port 1 View  Result Date: 03/18/2017 CLINICAL DATA:  Acute respiratory failure with hypoxia EXAM: PORTABLE CHEST 1 VIEW COMPARISON:  Yesterday FINDINGS: Right upper extremity PICC with tip at the upper SVC. Endotracheal tube tip between the clavicular heads and carina. An orogastric tube at least reaches the stomach. Significantly improved aeration at the left base. No generalized Kerley lines. Probable small left effusion. No pneumothorax. IMPRESSION: 1. Improved left lower lobe aeration after bronchoscopy yesterday. 2. Probable small left effusion. 3. New right upper extremity PICC in good position. 4. Interval uncoiling of the nasogastric tube. Electronically Signed   By: Monte Fantasia M.D.   On: 03/18/2017 07:41   Dg Chest Portable 1 View  Result Date: 03/17/2017 CLINICAL DATA:  Endotracheal tube placement.  OG tube placement. EXAM: PORTABLE CHEST 1 VIEW COMPARISON:  CT 03/23/2017. FINDINGS: Endotracheal tube tip 5 cm above the carina. Orogastric tube appears to be coiled in the stomach with its tip coiled back into the lower esophagus, repositioning suggested. Atelectasis left lower lobe with left-sided pleural effusion. No pneumothorax. Prior cervical spine fusion. Thoracic spine degenerative change. IMPRESSION: 1.  Endotracheal tube tip noted 5 cm above the carina. 2. Orogastric tube noted coiled in the stomach with its distal tip coiled back into the lower esophagus. Repositioning suggested. 3.  Left lower lobe atelectasis with left-sided pleural effusion. Critical Value/emergent results were called by telephone at the time of interpretation on 03/17/2017 at 1:19 pm to nurse Threasa Beards, who verbally acknowledged these results. Electronically Signed   By: Marcello Moores  Register   On: 03/17/2017 13:20   Dg Abd Portable 1v  Result Date: 03/17/2017 CLINICAL DATA:  Orogastric tube placement EXAM: PORTABLE ABDOMEN - 1 VIEW COMPARISON:  03/17/2017 FINDINGS: Gastric tube has been adjusted  with the tip now in the body the stomach. Bowel decompressed without obstruction or ileus Left lower lobe consolidation and left effusion IMPRESSION: Gastric tube now in the body the stomach.  Normal bowel gas pattern Left lower lobe consolidation. Electronically Signed   By: Franchot Gallo M.D.   On: 03/17/2017 13:49   Dg Abd Portable 1v  Result Date: 03/17/2017 CLINICAL DATA:  Orogastric tube placement. EXAM: PORTABLE ABDOMEN - 1 VIEW COMPARISON:  None. FINDINGS: The bowel gas pattern is normal. No radio-opaque calculi or other significant radiographic abnormality are seen. Orogastric tube is seen entering stomach, but tip looping back into the distal esophagus. IMPRESSION: Orogastric tube is seen looped within proximal stomach, but distal tip is directed back into the distal esophagus. Electronically Signed   By: Marijo Conception, M.D.   On: 03/17/2017 13:20   [place] Ekg Site Rite  Result Date: 03/17/2017 If Site Rite image not attached, placement could not be confirmed due to current cardiac rhythm.  STUDIES:  3/10  Cervical Spine CT >> pathologic fracture of C5 due to a large infiltrative mass that involves C5, C6, extends along the prevertebral soft tissues and encroaches upon the ventral epidural space.  This may reflect metastatic disease or a primary bony neoplasm.  3/10  CT Chest >> no acute intrathoracic, abdominal or pelvic pathology. No definiate malignancy or metastatic disease identified in the chest, abdomen or pelvis.  R lung subpleural nodular densities, likely areas of scarring  3/10  CT ABD/Pelvis >> thickened appearance of endometrium, indeterminate bilateral adrenal nodules 3/10  MRI Cervical Spine >> infiltrative mass in the C4, C5, C6 vertebrae which could be lymphoma, plasmacytoma or metastasis.   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  Large cystic / necrotic component which buldges  into the prevertebral space.  Paravertebral cervical strain.   CULTURES: MRSA PCR 3/11 >> negative   ANTIBIOTICS: 03/17/2017 Rocephin>>  SIGNIFICANT EVENTS: 3/10  Admit with generalized weakness, multiple falls 03/17/2017 intubated  LINES/TUBES: 03/17/2017 endotracheal tube>> 03/17/2017 right PICC in place>>  DISCUSSION: 77 y/o F admitted 3/10 with generalized weakness and multiple falls. Workup concerning for infiltrative mass in C4, C5, C6 with pathologic fracture in C5,C6.  In addition, there is concern for possible adrenal nodules and thickened endometrium.    ASSESSMENT / PLAN:  PULMONARY A: At Risk Aspiration  P:   Transfer to ICU for planned intubation Emergent intubation for inability to protect her airway, patient ok with short term intubation only  Monitor CXR Pre-oxygenate patient with 6L  Aspiration precautions Assess PCXR post intubation, make decision regarding aspiration coverage  Cultures from trach Suspect to require tracheostomy or transition to comfort care  CARDIOVASCULAR A:  Hypotension - resolved  P:  ICU monitoring  Tele monitoring IVF resuscitation  RENAL A:   No Acute Issues  P:   Trend BMP/urinary output Replace electrolytes as indicated Avoid nephrotoxic agents, ensure adequate renal perfusion   GASTROINTESTINAL A:   Aspiration Risk  P:   NPO  Bowel regimen > colace BID, senokot BID, PRN dulcolax  TF per nutrition  HEMATOLOGIC Recent Labs    03/17/17 0340 03/18/17 0010  HGB 12.4 11.2*    A:   Leukocytosis  P:  Trend CBC   INFECTIOUS A:   Leukocytosis - ? Reactive  Anemia  P:   Trend fever curve, WBC   ENDOCRINE CBG (last 3)  Recent Labs    03/18/17 0007 03/18/17 0306 03/18/17 0856  GLUCAP 161* 171* 184*    A:   At Risk Hypo/Hyperglycemia    P:   Trend glucose   NEUROLOGIC A:   Chronic Pain - neck/back  Post spinal surgery for tumor P:   RASS goal: 0 to -1  Sedation for vent  tolerance Blink to command but has poor control of lower extremities and upper extremities Functional quadriparetic   FAMILY  - Updates: 03/18/2017 no family at bedside patient sedated  - Inter-disciplinary family meet or Palliative Care meeting due by:  day 7

## 2017-03-18 NOTE — Procedures (Signed)
Pt placed back on full support at this time due to increased WOB & VT's <200. Pt tolerating full support well, RT will monitor

## 2017-03-19 ENCOUNTER — Inpatient Hospital Stay (HOSPITAL_COMMUNITY): Payer: Medicare Other

## 2017-03-19 DIAGNOSIS — G9340 Encephalopathy, unspecified: Secondary | ICD-10-CM

## 2017-03-19 LAB — BASIC METABOLIC PANEL
Anion gap: 7 (ref 5–15)
BUN: 18 mg/dL (ref 6–20)
CO2: 28 mmol/L (ref 22–32)
Calcium: 8.1 mg/dL — ABNORMAL LOW (ref 8.9–10.3)
Chloride: 107 mmol/L (ref 101–111)
Creatinine, Ser: 0.39 mg/dL — ABNORMAL LOW (ref 0.44–1.00)
GFR calc Af Amer: >60 mL/min (ref >60)
GLUCOSE: 182 mg/dL — AB (ref 65–99)
POTASSIUM: 4.4 mmol/L (ref 3.5–5.1)
Sodium: 142 mmol/L (ref 135–145)

## 2017-03-19 LAB — GLUCOSE, CAPILLARY
GLUCOSE-CAPILLARY: 134 mg/dL — AB (ref 65–99)
GLUCOSE-CAPILLARY: 125 mg/dL — AB (ref 65–99)
Glucose-Capillary: 199 mg/dL — ABNORMAL HIGH (ref 65–99)
Glucose-Capillary: 155 mg/dL — ABNORMAL HIGH (ref 65–99)
Glucose-Capillary: 140 mg/dL — ABNORMAL HIGH (ref 65–99)

## 2017-03-19 LAB — MAGNESIUM: Magnesium: 2 mg/dL (ref 1.7–2.4)

## 2017-03-19 LAB — PHOSPHORUS: Phosphorus: 1.9 mg/dL — ABNORMAL LOW (ref 2.5–4.6)

## 2017-03-19 MED ORDER — INSULIN ASPART 100 UNIT/ML ~~LOC~~ SOLN
0.0000 [IU] | SUBCUTANEOUS | Status: DC
  Administered 2017-03-19: 2 [IU] via SUBCUTANEOUS
  Administered 2017-03-19 (×2): 1 [IU] via SUBCUTANEOUS
  Administered 2017-03-19: 2 [IU] via SUBCUTANEOUS
  Administered 2017-03-20 – 2017-03-22 (×13): 1 [IU] via SUBCUTANEOUS

## 2017-03-19 NOTE — Progress Notes (Signed)
RT note-Placed back to full support for low VE.

## 2017-03-19 NOTE — Progress Notes (Signed)
Spoke with [facility] concerning pts need for insulin coverage. Awaiting orders. Will continue to monitor.

## 2017-03-19 NOTE — Progress Notes (Signed)
Patient remains on ventilator.  Oxygenating well.  Hemodynamically stable.  Wound clean and dry.  Neck soft.  Neurologically unchanged.  She is awake and appears aware.  She has no voluntary movement of upper or lower extremities.  Continue supportive efforts.  Patient's prospects for recovery are poor.

## 2017-03-19 NOTE — Progress Notes (Signed)
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 Pt s/p corpectomy C5-6 with C4-7 fusion. Intubated 03/17/17.  PMHx: tobacco use   OT comments  Focus of session on PROM B extremities and bed level/EOB mobility. Pt tolerating supported sitting at EOB x 6-7 minutes with stable vital signs on full vent support. Will continue to follow.  Follow Up Recommendations  SNF;Supervision/Assistance - 24 hour    Equipment Recommendations       Recommendations for Other Services      Precautions / Restrictions Precautions Precautions: Cervical;Fall Required Braces or Orthoses: Cervical Brace Cervical Brace: Hard collar;At all times       Mobility Bed Mobility Overal bed mobility: Needs Assistance Bed Mobility: Rolling;Sidelying to Sit;Sit to Supine Rolling: Total assist Sidelying to sit: +2 for physical assistance;Total assist  Sit to supine: Total assist;+2 for physical assistance   General bed mobility comments: rolling for positioning once supine,  helicopter technique for supine to sit with HOB elevated  Transfers                 General transfer comment: NT    Balance Overall balance assessment: Needs assistance Sitting-balance support: No upper extremity supported;Feet supported Sitting balance-Leahy Scale: Zero Sitting balance - Comments: sat EOB about 6-7 minutes with focus on upright tolerance and mobilizing secretions, cues for relaxation as on full vent support                                    ADL either performed or assessed with clinical judgement   ADL                                         General ADL Comments: dependent in all ADL     Vision       Perception     Praxis      Cognition Arousal/Alertness: Awake/alert Behavior During Therapy: Anxious Overall Cognitive Status: Difficult to assess                                          Exercises Other Exercises Other Exercises: provided PROM to 4 extremities in supine   Shoulder Instructions       General Comments vitals stable in sitting, but pt with limited tolerance due to feeling she was having difficulty breathing    Pertinent Vitals/ Pain       Pain Assessment: Faces Faces Pain Scale: Hurts whole lot Pain Location: shoulders with movement > 90 Pain Descriptors / Indicators: Grimacing Pain Intervention(s): Repositioned;Monitored during session  Home Living                                          Prior Functioning/Environment              Frequency  Min 2X/week  Progress Toward Goals  OT Goals(current goals can now be found in the care plan section)  Progress towards OT goals: Progressing toward goals  Acute Rehab OT Goals Time For Goal Achievement: 03/31/17 Potential to Achieve Goals: Alison Fisher Discharge plan remains appropriate    Co-evaluation    PT/OT/SLP Co-Evaluation/Treatment: Yes Reason for Co-Treatment: Complexity of the patient's impairments (multi-system involvement);For patient/therapist safety PT goals addressed during session: Mobility/safety with mobility;Balance OT goals addressed during session: Strengthening/ROM      AM-PAC PT "6 Clicks" Daily Activity     Outcome Measure   Help from another person eating meals?: Total Help from another person taking care of personal grooming?: Total Help from another person toileting, which includes using toliet, bedpan, or urinal?: Total Help from another person bathing (including washing, rinsing, drying)?: Total Help from another person to put on and taking off regular  upper body clothing?: Total Help from another person to put on and taking off regular lower body clothing?: Total 6 Click Score: 6    End of Session Equipment Utilized During Treatment: Other (comment)(ventilator)  OT Visit Diagnosis: Muscle weakness (generalized) (M62.81);Pain Pain - Right/Left: Left Pain - part of body: Shoulder   Activity Tolerance Patient tolerated treatment well   Patient Left in bed;with call bell/phone within reach;with bed alarm set   Nurse Communication          Time: 8850-2774 OT Time Calculation (min): 26 min  Charges: OT General Charges $OT Visit: 1 Visit OT Treatments $Therapeutic Activity: 8-22 mins  03/19/2017 Alison Fisher, OTR/L Pager: [phone]   Alison Fisher Alison Fisher 03/19/2017, 3:30 PM

## 2017-03-19 NOTE — Progress Notes (Addendum)
PULMONARY / CRITICAL CARE MEDICINE   Name: Alison Fisher MRN: [id number] DOB: [birth year]    ADMISSION DATE:  03/07/2017 CONSULTATION DATE:  03/17/2017  REFERRING MD:  Dr. Maryland Pink  CHIEF COMPLAINT:  Weakness  HISTORY OF PRESENT ILLNESS:   77 year old female with PMH of tobacco abuse, suspected COPD, and chronic neck and back pain admitted to [facility] on 3/10 with weakness.    Patient with progressive neck and right arm weakness thought to be cervical radiculopathy treated with steroids.  Presented on 3/10 after she slipped off the couch and was unable to get up.  On arrival to [facility], she was unable to move any extremity spontaneously with diffuse numbess.  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  No known prior history of cancer. She was transferred to [facility] for Neurosurgery evaluation.  MRI confirmed CT findings with ventral epidural mass and C5 pathologic fracture with retropulsion causing spinal stenosis with cord compression treated with decadron and cervical stabilization. CT chest and abdomen raise concern for possible adrenal nodules and thickened endometrium.  She underwent C5 and C6 corpectomy for decompression with plating C4-7 on 3/11.  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  On 3/12, patient with ongoing hypoxia and increased O2 requirements.  SLP evaluation noted that patient was unable to manage any secretions, gurgling, and concerned if patient could protect her airway.  PCCM consulted for possible intubation.    SUBJECTIVE:  Mechanically ventilated, no new events overnight   VITAL SIGNS: BP (!) 126/57 (BP Location: Left Arm)   Pulse (!) 57   Temp 98.9 F (37.2 C) (Oral)   Resp 17   Ht 5' (1.524 m)   Wt 43.5 kg (95 lb 14.4 oz)   SpO2 98%   BMI 18.73 kg/m   HEMODYNAMICS:    VENTILATOR SETTINGS: Vent Mode: PRVC FiO2 (%):  [40 %] 40 % Set Rate:  [16 bmp] 16 bmp Vt Set:  [350  mL] 350 mL PEEP:  [8 cmH20] 8 cmH20 Pressure Support:  [10 cmH20] 10 cmH20 Plateau Pressure:  [15 cmH20-19 cmH20] 15 cmH20  INTAKE / OUTPUT: I/O last 3 completed shifts: In: 5124.8 [I.V.:3809.8; NG/GT:1260; IV Piggyback:55] Out: 845 [Urine:845]  PHYSICAL EXAMINATION: General: Frail elderly female on vent HEENT: Cervical collar in place, anterior cervical surgery site intact endotracheal tube to ventilator Neuro: Effectively quadriparetic with very little movement of lower and upper extremities CV: Heart sounds are regular PULM: even/non-labored, lungs bilaterally crease in bases OX:BDZH, non-tender, bsx4 active  Extremities: warm/dry, no significant edema Skin: no rashes or lesions   LABS:  BMET Recent Labs  Lab 03/17/17 0340 03/18/17 0010 03/19/17 0357  NA 139 140 142  K 4.1 3.6 4.4  CL 103 105 107  CO2 29 27 28   BUN 11 13 18   CREATININE 0.52 0.48 0.39*  GLUCOSE 104* 177* 182*    Electrolytes Recent Labs  Lab 03/17/17 0340 03/18/17 0010 03/19/17 0357  CALCIUM 8.4* 8.0* 8.1*  MG  --  1.8 2.0  PHOS  --  2.1* 1.9*    CBC Recent Labs  Lab 03/12/2017 1630 03/16/2017 1635 03/17/17 0340 03/18/17 0010  WBC 15.7*  --  15.1* 12.8*  HGB 16.4* 17.3* 12.4 11.2*  HCT 48.4* 51.0* 38.7 35.9*  PLT 315  --  262 251    Coag's Recent Labs  Lab 03/21/2017 1630 03/17/17 0340  APTT  --  28  INR 0.89 0.96  Sepsis Markers No results for input(s): LATICACIDVEN, PROCALCITON, O2SATVEN in the last 168 hours.  ABG Recent Labs  Lab 03/16/2017 2200 03/17/17 1253  PHART 7.387 7.402  PCO2ART 46.2 49.5*  PO2ART 113.0* 159.0*    Liver Enzymes Recent Labs  Lab 03/28/2017 1630  AST 30  ALT 30  ALKPHOS 69  BILITOT 1.3*  ALBUMIN 3.7    Cardiac Enzymes Recent Labs  Lab 03/20/2017 1630  TROPONINI <0.03    Glucose Recent Labs  Lab 03/18/17 1621 03/18/17 2009 03/18/17 2335 03/19/17 0349 03/19/17 0833 03/19/17 1156  GLUCAP 177* 190* 167* 199* 155* 140*     Imaging Dg Chest Port 1 View  Result Date: 03/19/2017 CLINICAL DATA:  Respiratory failure. EXAM: PORTABLE CHEST 1 VIEW COMPARISON:  03/18/2017. FINDINGS: Endotracheal tube, NG tube, right PICC line in stable position. Heart size stable. Persistent left lung infiltrate with left-sided pleural effusion. This small right pleural effusion cannot be excluded. No interim change. Prior cervical spine fusion. IMPRESSION: 1.  Lines and tubes in stable position. 2. Persistent left lung infiltrate with left-sided pleural effusion. Small right pleural effusions may also be present. Similar findings noted on prior chest x-ray. Electronically Signed   By: Marcello Moores  Register   On: 03/19/2017 09:05   STUDIES:  3/10  Cervical Spine CT >> pathologic fracture of C5 due to a large infiltrative mass that involves C5, C6, extends along the prevertebral soft tissues and encroaches upon the ventral epidural space.  This may reflect metastatic disease or a primary bony neoplasm.  3/10  CT Chest >> no acute intrathoracic, abdominal or pelvic pathology. No definiate malignancy or metastatic disease identified in the chest, abdomen or pelvis.  R lung subpleural nodular densities, likely areas of scarring  3/10  CT ABD/Pelvis >> thickened appearance of endometrium, indeterminate bilateral adrenal nodules 3/10  MRI Cervical Spine >> infiltrative mass in the C4, C5, C6 vertebrae which could be lymphoma, plasmacytoma or metastasis.   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  Large cystic / necrotic component which buldges into the prevertebral space.  Paravertebral cervical strain.  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 Workup concerning for infiltrative mass in C4, C5, C6 with pathologic fracture in C5,C6.  In addition, there is concern for possible adrenal nodules and thickened endometrium.    ASSESSMENT / PLAN:  PULMONARY A: A acute respiratory failure due to inability to protect her airway P:   Continue current ventilator support.  We will need to have discussions with patient, friend as no family is available.  Need to determine goals for her care, which we will continue airway protection and ventilation.  If so then she would likely need tracheostomy.  This would need to be done by ENT given her anterior cervical fusion.  It may be reasonable to broach the subject of a one-way extubation with plans to withdraw care if she were to fail.  This in the setting of a fairly dismal prognosis given her quadriplegia and the B-cell lymphoma.  CARDIOVASCULAR A:  Hypotension - resolved  P:  Follow hemodynamics Gentle IV resuscitation n  RENAL A:   No  Acute Issues  P:   Follow urine output, BMP Replace electrolytes as indicated   GASTROINTESTINAL A:   Aspiration Risk  P:   Tube feeding Bowel regimen > colace BID, senokot BID, PRN dulcolax   HEMATOLOGIC/oncology A:   Leukocytosis  New diagnosis B-cell lymphoma P:  Follow CBC Defer heparin prophylaxis post cervical surgery Unclear that there is any benefit at this time to involving hematology oncology.  Overall prognosis unclear particularly with regard to restoration of neurological function.  I would like to try to sort out goals for care before involving oncology.  INFECTIOUS A:   Leukocytosis - ? Reactive  Anemia  P:   Follow clinically, fever curve, WBC   ENDOCRINE A:   At Risk Hypo/Hyperglycemia    P:   Follow glucose  NEUROLOGIC A:   Chronic  Pain - neck/back  Post spinal surgery for tumor P:   RASS goal: 0 to -1  Minimize sedation Functionally quadriplegic, unclear whether there is any improvement to be had here   FAMILY  - Updates: 03/18/2017 no family at bedside patient sedated  - Inter-disciplinary family meet or Palliative Care meeting due by:  day 7   .Independent CC time 31 minutes   Baltazar Apo, MD, PhD 03/19/2017, 1:41 PM [facility] [phone] or if no answer [phone]

## 2017-03-19 NOTE — Progress Notes (Signed)
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 Pt s/p corpectomy C5-6 with C4-7 fusion. PMHx: tobacco use    PT Comments    Patient progressing slowly as re-intubated since last session.  Also with limited tolerance due to respiratory status.  Will follow acutely and continue to recommend SNF level rehab at d/c.   Follow Up Recommendations  Supervision/Assistance - 24 hour;SNF     Equipment Recommendations  Wheelchair (measurements PT);Hospital bed;Wheelchair cushion (measurements PT)    Recommendations for Other Services       Precautions / Restrictions Precautions Precautions: Cervical;Fall Required Braces or Orthoses: Cervical Brace Cervical Brace: Hard collar;At all times    Mobility  Bed Mobility Overal bed mobility: Needs Assistance Bed Mobility: Rolling;Sidelying to Sit;Sit to Supine Rolling: Total assist   Supine to sit: Total assist;+2 for physical assistance Sit to supine: Total assist;+2 for physical assistance   General bed mobility comments: rolling for positioning once supine,  helicopter technique for supine to sit with HOB elevated  Transfers                 General transfer comment: NT  Ambulation/Gait                 Stairs            Wheelchair Mobility    Modified Rankin (Stroke Patients Only)       Balance Overall balance assessment: Needs assistance Sitting-balance support: No upper extremity supported;Feet supported Sitting balance-Leahy Scale: Zero Sitting balance - Comments: sat EOB about 6-7 minutes with focus on upright tolerance and mobilizing secretions, cues for  relaxation as on full vent support                                     Cognition Arousal/Alertness: Awake/alert Behavior During Therapy: Anxious Overall Cognitive Status: Difficult to assess                                        Exercises Other Exercises Other Exercises: provided PROM to 4 extremities in supine    General Comments General comments (skin integrity, edema, etc.): vitals stable in sitting, but pt with limited tolerance due to feeling she was having difficulty breathing      Pertinent Vitals/Pain Faces Pain Scale: Hurts whole lot Pain Location: shoulders with movement > 90 Pain Descriptors / Indicators: Grimacing Pain Intervention(s): Monitored during session;Limited activity within patient's tolerance    Home Living                      Prior Function            PT Goals (current goals can now be found in the care plan section) Progress towards PT goals: Progressing toward goals(slow)    Frequency    Min 3X/week      PT Plan Current plan remains appropriate    Co-evaluation PT/OT/SLP Co-Evaluation/Treatment: Yes Reason for Co-Treatment: Complexity of the patient's impairments (multi-system involvement);For patient/therapist safety;Other (comment)(won't tolerate two  sessions) PT goals addressed during session: Mobility/safety with mobility;Balance        AM-PAC PT "6 Clicks" Daily Activity  Outcome Measure  Difficulty turning over in bed (including adjusting bedclothes, sheets and blankets)?: Unable Difficulty moving from lying on back to sitting on the side of the bed? : Unable Difficulty sitting down on and standing up from a chair with arms (e.g., wheelchair, bedside commode, etc,.)?: Unable Help needed moving to and from a bed to chair (including a wheelchair)?: Total Help needed walking in hospital room?: Total Help needed climbing 3-5 steps with a railing? : Total 6 Click Score: 6    End of  Session Equipment Utilized During Treatment: Other (comment)(vent) Activity Tolerance: Patient limited by fatigue Patient left: in bed   PT Visit Diagnosis: Muscle weakness (generalized) (M62.81);Other abnormalities of gait and mobility (R26.89);Other (comment)(quadraparesis)     Time: 6270-3500 PT Time Calculation (min) (ACUTE ONLY): 28 min  Charges:  $Therapeutic Activity: 8-22 mins                    G CodesMagda Fisher, [place] [phone] 03/19/2017    Alison Fisher 03/19/2017, 1:42 PM

## 2017-03-19 NOTE — Progress Notes (Signed)
Delfin Gant, pts sister called to get an update & stated that she had asked multiple people to have MD call her being that she is 77 years old & leaves in [place] without transportation to get here to see pt. This RN informed her that note would be made. Dr. Lamonte Sakai called and informed that pts sister requested an update from MD as soon as possible. Will pass on to night shift that pts sister would like a call from MD as soon as available. Delfin Gant # [phone]

## 2017-03-20 LAB — GLUCOSE, CAPILLARY
GLUCOSE-CAPILLARY: 108 mg/dL — AB (ref 65–99)
GLUCOSE-CAPILLARY: 124 mg/dL — AB (ref 65–99)
GLUCOSE-CAPILLARY: 129 mg/dL — AB (ref 65–99)
GLUCOSE-CAPILLARY: 131 mg/dL — AB (ref 65–99)
GLUCOSE-CAPILLARY: 140 mg/dL — AB (ref 65–99)
Glucose-Capillary: 122 mg/dL — ABNORMAL HIGH (ref 65–99)

## 2017-03-20 LAB — CBC
HCT: 34 % — ABNORMAL LOW (ref 36.0–46.0)
Hemoglobin: 10.8 g/dL — ABNORMAL LOW (ref 12.0–15.0)
MCH: 33.6 pg (ref 26.0–34.0)
MCHC: 31.8 g/dL (ref 30.0–36.0)
MCV: 105.9 fL — ABNORMAL HIGH (ref 78.0–100.0)
Platelets: 197 10*3/uL (ref 150–400)
RBC: 3.21 MIL/uL — ABNORMAL LOW (ref 3.87–5.11)
RDW: 13.6 % (ref 11.5–15.5)
WBC: 9.1 10*3/uL (ref 4.0–10.5)

## 2017-03-20 LAB — BASIC METABOLIC PANEL
ANION GAP: 5 (ref 5–15)
BUN: 17 mg/dL (ref 6–20)
CALCIUM: 8.3 mg/dL — AB (ref 8.9–10.3)
CO2: 31 mmol/L (ref 22–32)
Chloride: 108 mmol/L (ref 101–111)
Creatinine, Ser: 0.35 mg/dL — ABNORMAL LOW (ref 0.44–1.00)
GFR calc Af Amer: >60 mL/min (ref >60)
GLUCOSE: 129 mg/dL — AB (ref 65–99)
Potassium: 4.5 mmol/L (ref 3.5–5.1)
Sodium: 144 mmol/L (ref 135–145)

## 2017-03-20 LAB — CULTURE, BAL-QUANTITATIVE

## 2017-03-20 LAB — MAGNESIUM: MAGNESIUM: 2 mg/dL (ref 1.7–2.4)

## 2017-03-20 LAB — CULTURE, BAL-QUANTITATIVE W GRAM STAIN: Culture: NORMAL

## 2017-03-20 NOTE — Progress Notes (Signed)
No significant change in status.  Remains intubated.  Minimal right upper extremity movement.  Wound looks good.  Continue supportive efforts.  I agree with Dr. Agustina Caroli plan of one-way extubation once patient medically optimized.

## 2017-03-20 NOTE — Progress Notes (Signed)
PULMONARY / CRITICAL CARE MEDICINE   Name: Alison Fisher MRN: [id number] DOB: 03-13-1940    ADMISSION DATE:  03/26/2017 CONSULTATION DATE:  03/17/2017  REFERRING MD:  Dr. Maryland Pink  CHIEF COMPLAINT:  Weakness  HISTORY OF PRESENT ILLNESS:   77 year old female with PMH of tobacco abuse, suspected COPD, and chronic neck and back pain admitted to [facility] on 3/10 with weakness.    Patient with progressive neck and right arm weakness thought to be cervical radiculopathy treated with steroids.  Presented on 3/10 after she slipped off the couch and was unable to get up.  On arrival to [facility], she was unable to move any extremity spontaneously with diffuse numbess.  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  No known prior history of cancer. She was transferred to [facility] for Neurosurgery evaluation.  MRI confirmed CT findings with ventral epidural mass and C5 pathologic fracture with retropulsion causing spinal stenosis with cord compression treated with decadron and cervical stabilization. CT chest and abdomen raise concern for possible adrenal nodules and thickened endometrium.  She underwent C5 and C6 corpectomy for decompression with plating C4-7 on 3/11.  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  On 3/12, patient with ongoing hypoxia and increased O2 requirements.  SLP evaluation noted that patient was unable to manage any secretions, gurgling, and concerned if patient could protect her airway.  PCCM consulted for intubation.    SUBJECTIVE:  Patient is awake, remains mechanically ventilated She did not tolerate pressure support due to low volumes this morning   VITAL SIGNS: BP (!) 122/55   Pulse 60   Temp 98.7 F (37.1 C) (Oral)   Resp 18   Ht 5' (1.524 m)   Wt 44 kg (97 lb 0 oz)   SpO2 98%   BMI 18.94 kg/m   HEMODYNAMICS:    VENTILATOR SETTINGS: Vent Mode: PRVC FiO2 (%):  [40 %] 40 % Set Rate:   [16 bmp] 16 bmp Vt Set:  [350 mL] 350 mL PEEP:  [5 cmH20-8 cmH20] 5 cmH20 Pressure Support:  [12 cmH20] 12 cmH20 Plateau Pressure:  [14 cmH20-15 cmH20] 14 cmH20  INTAKE / OUTPUT: I/O last 3 completed shifts: In: 3707.5 [I.V.:2532.5; NG/GT:1120; IV Piggyback:55] Out: 72 [Urine:970; Emesis/NG output:75]  PHYSICAL EXAMINATION: General: Thin elderly woman on mechanical ventilation HEENT: Cervical collar in place, anterior cervical surgery site intact, endotracheal tube in place Neuro: Barely able to move her right fingers, no movement on the left, no lower extremity movement, toes upgoing bilaterally CV: Regular without a murmur PULM: Decreased at both bases, some scattered coarse breath sounds, no wheezing GI: Soft, nontender with positive bowel sounds Extremities: No significant edema Skin: No rash or skin breakdown   LABS:  BMET Recent Labs  Lab 03/18/17 0010 03/19/17 0357 03/20/17 0414  NA 140 142 144  K 3.6 4.4 4.5  CL 105 107 108  CO2 27 28 31   BUN 13 18 17   CREATININE 0.48 0.39* 0.35*  GLUCOSE 177* 182* 129*    Electrolytes Recent Labs  Lab 03/18/17 0010 03/19/17 0357 03/20/17 0414  CALCIUM 8.0* 8.1* 8.3*  MG 1.8 2.0 2.0  PHOS 2.1* 1.9*  --     CBC Recent Labs  Lab 03/17/17 0340 03/18/17 0010 03/20/17 0414  WBC 15.1* 12.8* 9.1  HGB 12.4 11.2* 10.8*  HCT 38.7 35.9* 34.0*  PLT 262 251 197    Coag's Recent Labs  Lab 03/18/2017 1630 03/17/17  0340  APTT  --  28  INR 0.89 0.96    Sepsis Markers No results for input(s): LATICACIDVEN, PROCALCITON, O2SATVEN in the last 168 hours.  ABG Recent Labs  Lab 03/06/2017 2200 03/17/17 1253  PHART 7.387 7.402  PCO2ART 46.2 49.5*  PO2ART 113.0* 159.0*    Liver Enzymes Recent Labs  Lab 03/20/2017 1630  AST 30  ALT 30  ALKPHOS 69  BILITOT 1.3*  ALBUMIN 3.7    Cardiac Enzymes Recent Labs  Lab 03/27/2017 1630  TROPONINI <0.03    Glucose Recent Labs  Lab 03/19/17 1156 03/19/17 1706  03/19/17 2042 03/20/17 0016 03/20/17 0341 03/20/17 0713  GLUCAP 140* 134* 125* 124* 131* 108*    Imaging No results found.  STUDIES:  3/10  Cervical Spine CT >> pathologic fracture of C5 due to a large infiltrative mass that involves C5, C6, extends along the prevertebral soft tissues and encroaches upon the ventral epidural space.  This may reflect metastatic disease or a primary bony neoplasm.  3/10  CT Chest >> no acute intrathoracic, abdominal or pelvic pathology. No definiate malignancy or metastatic disease identified in the chest, abdomen or pelvis.  R lung subpleural nodular densities, likely areas of scarring  3/10  CT ABD/Pelvis >> thickened appearance of endometrium, indeterminate bilateral adrenal nodules 3/10  MRI Cervical Spine >> infiltrative mass in the C4, C5, C6 vertebrae which could be lymphoma, plasmacytoma or metastasis.   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  Large cystic / necrotic component which buldges into the prevertebral space.  Paravertebral cervical strain.  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 Workup concerning for infiltrative mass in C4, C5, C6 with pathologic fracture in C5,C6.  In addition, there is concern for possible adrenal nodules and thickened endometrium.    Pathology shows large B-cell lymphoma  ASSESSMENT / PLAN:  PULMONARY A: A acute respiratory failure due to inability to protect her airway P:   Options here include continued support that would ultimately lead to  tracheostomy and chronic ventilator.  This in the setting of paralysis, untreatable B-cell lymphoma sounds like a measurable quality of life.  Continue current ventilator support for now.  I discussed the patient's situation and prognosis with her sister by phone today 3/15.  Sister indicates that she would not want her to be in discomfort, does not want prolonged extraordinary care if she has no chance for meaningful recovery.  Based on this I think the best strategy will be a one-way extubation when we believe she has been optimized.  If she fails then transition to comfort care would be appropriate.  Her sister is open to this.  She would like to be present if possible but she needs to travel from [place] which is difficult.  She is working on [employer].  CARDIOVASCULAR A:  Hypotension - resolved  P:  Continue current IV fluid resuscitation, decrease rate to 30 cc/h  RENAL A:   No Acute Issues  P:   Follow urine output, BMP Replace electrolytes as indicated   GASTROINTESTINAL A:   Aspiration Risk  P:   Initiate tube feeding 3/15  Bowel regimen > colace BID, senokot BID, PRN dulcolax   HEMATOLOGIC/oncology A:   Leukocytosis  New diagnosis B-cell lymphoma P:  Continue to follow CBC Defer heparin or anticoagulation for DVT prophylaxis given her cervical spine surgery I have not called hematology oncology with regard to the B-cell lymphoma.  Based on her overall prognosis, her current debilitation not clear to me that she would either tolerate or benefit from treatment for this.  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  We will continue dexamethasone as ordered, follow neurological status Neurosurgery following   FAMILY  - Updates: I spoke with the patient's sister by phone on 3/15.  Explained her current status, prognosis, critical care issues.  I have recommended that based on her overall prognosis for meaningful recovery that we should consider a one-way extubation in the near future based on her ability to tolerate.  Sister indicates that she would like to come to [place] if possible from [place] and this may affect the timing of an extubation as well.  I have recommended against prolonged mechanical ventilation which would require tracheostomy and would commit her to paralysis, B-cell lymphoma, etc. until she dies.   - Inter-disciplinary family meet or Palliative Care meeting due by: 3/21   .Independent CC time 40 minutes  Baltazar Apo, MD, PhD 03/20/2017, 10:11 AM [facility] [phone] or if no answer [phone]

## 2017-03-20 NOTE — Progress Notes (Signed)
Pt placed back on full vent support due to decreased RR and MVe.  RN notified.

## 2017-03-21 LAB — GLUCOSE, CAPILLARY
GLUCOSE-CAPILLARY: 117 mg/dL — AB (ref 65–99)
GLUCOSE-CAPILLARY: 126 mg/dL — AB (ref 65–99)
GLUCOSE-CAPILLARY: 129 mg/dL — AB (ref 65–99)
GLUCOSE-CAPILLARY: 130 mg/dL — AB (ref 65–99)
Glucose-Capillary: 127 mg/dL — ABNORMAL HIGH (ref 65–99)
Glucose-Capillary: 119 mg/dL — ABNORMAL HIGH (ref 65–99)

## 2017-03-21 NOTE — Progress Notes (Signed)
PULMONARY / CRITICAL CARE MEDICINE   Name: Alison Fisher MRN: [id number] DOB: [birth year]    ADMISSION DATE:  04/05/2017 CONSULTATION DATE:  03/17/2017  REFERRING MD:  Dr. Maryland Pink  CHIEF COMPLAINT:  Weakness  HISTORY OF PRESENT ILLNESS:   77 year old female with PMH of tobacco abuse, suspected COPD, and chronic neck and back pain admitted to [facility] on 3/10 with weakness.    Patient with progressive neck and right arm weakness thought to be cervical radiculopathy treated with steroids.  Presented on 3/10 after she slipped off the couch and was unable to get up.  On arrival to [facility], she was unable to move any extremity spontaneously with diffuse numbess.  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  No known prior history of cancer. She was transferred to [facility] for Neurosurgery evaluation.  MRI confirmed CT findings with ventral epidural mass and C5 pathologic fracture with retropulsion causing spinal stenosis with cord compression treated with decadron and cervical stabilization. CT chest and abdomen raise concern for possible adrenal nodules and thickened endometrium.  She underwent C5 and C6 corpectomy for decompression with plating C4-7 on 3/11.  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  On 3/12, patient with ongoing hypoxia and increased O2 requirements.  SLP evaluation noted that patient was unable to manage any secretions, gurgling, and concerned if patient could protect her airway.  PCCM consulted for intubation.    SUBJECTIVE:  Awake but unable to move.   VITAL SIGNS: BP (!) 116/53   Pulse (!) 49   Temp 98.5 F (36.9 C) (Oral)   Resp 18   Ht 5' (1.524 m)   Wt 44.8 kg (98 lb 12.3 oz)   SpO2 97%   BMI 19.29 kg/m   HEMODYNAMICS:    VENTILATOR SETTINGS: Vent Mode: PRVC FiO2 (%):  [30 %] 30 % Set Rate:  [16 bmp] 16 bmp Vt Set:  [350 mL] 350 mL PEEP:  [5 cmH20] 5 cmH20 Plateau Pressure:   [13 cmH20-15 cmH20] 14 cmH20  INTAKE / OUTPUT: I/O last 3 completed shifts: In: 5621 [I.V.:1886; NG/GT:1290; IV Piggyback:200] Out: 1010 [Urine:730; Emesis/NG output:280]  PHYSICAL EXAMINATION: General: Frail cachectic female who is quadriparetic HEENT: Endotracheal tube in place PSY: Not available Neuro: Tracks and follows but otherwise cannot follow commands CV: Sounds are regular PULM: Crease breath sounds HY:QMVH, non-tender, bsx4 active  Extremities: warm/dry, positive edema, muscle wasting noted Skin: no rashes or lesions    LABS:  BMET Recent Labs  Lab 03/18/17 0010 03/19/17 0357 03/20/17 0414  NA 140 142 144  K 3.6 4.4 4.5  CL 105 107 108  CO2 27 28 31   BUN 13 18 17   CREATININE 0.48 0.39* 0.35*  GLUCOSE 177* 182* 129*    Electrolytes Recent Labs  Lab 03/18/17 0010 03/19/17 0357 03/20/17 0414  CALCIUM 8.0* 8.1* 8.3*  MG 1.8 2.0 2.0  PHOS 2.1* 1.9*  --     CBC Recent Labs  Lab 03/17/17 0340 03/18/17 0010 03/20/17 0414  WBC 15.1* 12.8* 9.1  HGB 12.4 11.2* 10.8*  HCT 38.7 35.9* 34.0*  PLT 262 251 197    Coag's Recent Labs  Lab 03/26/2017 1630 03/17/17 0340  APTT  --  28  INR 0.89 0.96    Sepsis Markers No results for input(s): LATICACIDVEN, PROCALCITON, O2SATVEN in the last 168 hours.  ABG Recent Labs  Lab 04/01/2017 2200 03/17/17 1253  PHART 7.387 7.402  PCO2ART 46.2  49.5*  PO2ART 113.0* 159.0*    Liver Enzymes Recent Labs  Lab 03/20/2017 1630  AST 30  ALT 30  ALKPHOS 69  BILITOT 1.3*  ALBUMIN 3.7    Cardiac Enzymes Recent Labs  Lab 03/30/2017 1630  TROPONINI <0.03    Glucose Recent Labs  Lab 03/20/17 1515 03/20/17 2016 03/21/17 0008 03/21/17 0401 03/21/17 0825 03/21/17 1213  GLUCAP 129* 122* 130* 127* 129* 119*    Imaging No results found.  STUDIES:  3/10  Cervical Spine CT >> pathologic fracture of C5 due to a large infiltrative mass that involves C5, C6, extends along the prevertebral soft tissues and  encroaches upon the ventral epidural space.  This may reflect metastatic disease or a primary bony neoplasm.  3/10  CT Chest >> no acute intrathoracic, abdominal or pelvic pathology. No definiate malignancy or metastatic disease identified in the chest, abdomen or pelvis.  R lung subpleural nodular densities, likely areas of scarring  3/10  CT ABD/Pelvis >> thickened appearance of endometrium, indeterminate bilateral adrenal nodules 3/10  MRI Cervical Spine >> infiltrative mass in the C4, C5, C6 vertebrae which could be lymphoma, plasmacytoma or metastasis.   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  Large cystic / necrotic component which buldges into the prevertebral space.  Paravertebral cervical strain.  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 Workup concerning for infiltrative mass in C4, C5, C6 with pathologic fracture in C5,C6.  In addition, there is concern for possible adrenal nodules and thickened endometrium.    Pathology shows large B-cell lymphoma.  She has failure to thrive and quadriparesis therefore any chance of meaningful living are almost nil at this point.  ASSESSMENT / PLAN:  PULMONARY A: A acute respiratory failure due to inability to protect her airway P:   Options here include continued support that would ultimately lead to tracheostomy and chronic ventilator.  This in the setting of paralysis, untreatable B-cell lymphoma sounds like a measurable quality of life.  Continue ventilator support for now.   Options will be discussed for withdrawal of care in the near future with sister.  CARDIOVASCULAR A:  Hypotension - resolved  P:  Continue IV fluids and tube feeds  RENAL Lab Results  Component Value Date   CREATININE 0.35 (L) 03/20/2017   CREATININE 0.39 (L) 03/19/2017   CREATININE 0.48 03/18/2017   Recent Labs  Lab 03/18/17 0010 03/19/17 0357 03/20/17 0414  K 3.6 4.4 4.5    A:   No Acute Issues  P:   Follow electrolytes   GASTROINTESTINAL A:   Aspiration Risk  P:    currently on tube feeding since 315  HEMATOLOGIC/oncology Recent Labs    03/20/17 0414  HGB 10.8*    A:   Leukocytosis  New diagnosis B-cell lymphoma P:  Trend CBC   INFECTIOUS A:   Probable left lower lobe aspiration pneumonia Anemia  P:    day 5 of ceftriaxone Culture data follow  ENDOCRINE A:   At Risk Hypo/Hyperglycemia    CBG (last 3)  Recent Labs    03/21/17 0401 03/21/17 0825 03/21/17 1213  GLUCAP 127* 129* 119*  P:   Sliding scale insulin  NEUROLOGIC A:   Chronic Pain - neck/back  Post spinal surgery for tumor 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  Explained her current status, prognosis, critical care issues.  I have recommended that based on her overall prognosis for meaningful recovery that we should consider a one-way extubation in the near future based on her ability to tolerate.  Sister indicates that she would like to come to [place] if possible from [place] and this may affect the timing of an extubation as well.  I have recommended against prolonged mechanical ventilation which would require tracheostomy and would commit her to paralysis, B-cell lymphoma, etc. until she  dies.  03/30/2017 no family at bedside. - Inter-disciplinary family meet or Palliative Care meeting due by: 3/21   NP critical care time 30 minutes   Richardson Landry Minor ACNP Maryanna Shape PCCM Pager [phone] till 1 pm If no answer page [phone] 2017-03-30, 2:22 PM

## 2017-03-21 NOTE — Progress Notes (Signed)
Patient ID: Alison Fisher, female   DOB: [birth year], 77 y.o.   MRN: [id number] No significant change neurologically patient is awake but no movement upper or lower extremities.  Agree with supportive care extubation of Monday.Prognosis Very poor

## 2017-03-22 ENCOUNTER — Inpatient Hospital Stay (HOSPITAL_COMMUNITY): Payer: Medicare Other

## 2017-03-22 LAB — CBC WITH DIFFERENTIAL/PLATELET
Basophils Absolute: 0 10*3/uL (ref 0.0–0.1)
Basophils Relative: 0 %
EOS ABS: 0 10*3/uL (ref 0.0–0.7)
EOS PCT: 0 %
HCT: 35.4 % — ABNORMAL LOW (ref 36.0–46.0)
Hemoglobin: 11.4 g/dL — ABNORMAL LOW (ref 12.0–15.0)
LYMPHS ABS: 0.6 10*3/uL — AB (ref 0.7–4.0)
LYMPHS PCT: 5 %
MCH: 33.4 pg (ref 26.0–34.0)
MCHC: 32.2 g/dL (ref 30.0–36.0)
MCV: 103.8 fL — AB (ref 78.0–100.0)
MONO ABS: 0.7 10*3/uL (ref 0.1–1.0)
Monocytes Relative: 6 %
Neutro Abs: 10.1 10*3/uL — ABNORMAL HIGH (ref 1.7–7.7)
Neutrophils Relative %: 89 %
PLATELETS: 203 10*3/uL (ref 150–400)
RBC: 3.41 MIL/uL — ABNORMAL LOW (ref 3.87–5.11)
RDW: 13.2 % (ref 11.5–15.5)
WBC: 11.3 10*3/uL — AB (ref 4.0–10.5)

## 2017-03-22 LAB — BASIC METABOLIC PANEL
Anion gap: 7 (ref 5–15)
BUN: 17 mg/dL (ref 6–20)
CALCIUM: 7.9 mg/dL — AB (ref 8.9–10.3)
CO2: 31 mmol/L (ref 22–32)
Chloride: 99 mmol/L — ABNORMAL LOW (ref 101–111)
GLUCOSE: 132 mg/dL — AB (ref 65–99)
Potassium: 4.4 mmol/L (ref 3.5–5.1)
SODIUM: 137 mmol/L (ref 135–145)

## 2017-03-22 LAB — CULTURE, BLOOD (ROUTINE X 2)
Culture: NO GROWTH
Culture: NO GROWTH

## 2017-03-22 LAB — GLUCOSE, CAPILLARY
GLUCOSE-CAPILLARY: 146 mg/dL — AB (ref 65–99)
GLUCOSE-CAPILLARY: 118 mg/dL — AB (ref 65–99)
GLUCOSE-CAPILLARY: 137 mg/dL — AB (ref 65–99)
Glucose-Capillary: 123 mg/dL — ABNORMAL HIGH (ref 65–99)
Glucose-Capillary: 133 mg/dL — ABNORMAL HIGH (ref 65–99)

## 2017-03-22 LAB — PHOSPHORUS: Phosphorus: 3.5 mg/dL (ref 2.5–4.6)

## 2017-03-22 LAB — MAGNESIUM: MAGNESIUM: 2 mg/dL (ref 1.7–2.4)

## 2017-03-22 NOTE — Progress Notes (Signed)
Pt terminally extubated.  Family and RNs at bedside. Pt tolerated well at this time.

## 2017-03-22 NOTE — Progress Notes (Signed)
PULMONARY / CRITICAL CARE MEDICINE   Name: Alison Fisher MRN: [id number] DOB: April 19, 1940    ADMISSION DATE:  04/04/2017 CONSULTATION DATE:  03/17/2017  REFERRING MD:  Dr. Maryland Pink  CHIEF COMPLAINT:  Weakness  HISTORY OF PRESENT ILLNESS:   77 year old female with PMH of tobacco abuse, suspected COPD, and chronic neck and back pain admitted to [facility] on 3/10 with weakness.    Patient with progressive neck and right arm weakness thought to be cervical radiculopathy treated with steroids.  Presented on 3/10 after she slipped off the couch and was unable to get up.  On arrival to [facility], she was unable to move any extremity spontaneously with diffuse numbess.  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  No known prior history of cancer. She was transferred to [facility] for Neurosurgery evaluation.  MRI confirmed CT findings with ventral epidural mass and C5 pathologic fracture with retropulsion causing spinal stenosis with cord compression treated with decadron and cervical stabilization. CT chest and abdomen raise concern for possible adrenal nodules and thickened endometrium.  She underwent C5 and C6 corpectomy for decompression with plating C4-7 on 3/11.  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  On 3/12, patient with ongoing hypoxia and increased O2 requirements.  SLP evaluation noted that patient was unable to manage any secretions, gurgling, and concerned if patient could protect her airway.  PCCM consulted for intubation.    SUBJECTIVE:  No significant change in status, she is awake aware but unable to move from neck down   VITAL SIGNS: BP 128/60   Pulse (!) 52   Temp 98.3 F (36.8 C) (Oral)   Resp 18   Ht 5' (1.524 m)   Wt 45 kg (99 lb 3.3 oz)   SpO2 96%   BMI 19.38 kg/m   HEMODYNAMICS:    VENTILATOR SETTINGS: Vent Mode: PSV;CPAP FiO2 (%):  [30 %] 30 % Set Rate:  [16 bmp] 16 bmp Vt Set:  [350  mL] 350 mL PEEP:  [5 cmH20] 5 cmH20 Pressure Support:  [12 cmH20] 12 cmH20 Plateau Pressure:  [11 cmH20-14 cmH20] 14 cmH20  INTAKE / OUTPUT: I/O last 3 completed shifts: In: 2854.6 [I.V.:1229.6; NG/GT:1315; IV Piggyback:310] Out: 1315 [Urine:1315]  PHYSICAL EXAMINATION: General: Frail cachectic white female who is quadriparetic and obviously uncomfortable at rest HEENT: Endotracheal tube in place feeding tube in place cervical collar in place PSY: None available Neuro: Nausea to questions poor movement of lower and upper extremity CV: Heart sounds are regular PULM: Decreased in bases WU:JWJX, non-tender, bsx4 active  Extremities: warm/dry, 1+ edema  Skin: no rashes or lesions     LABS:  BMET Recent Labs  Lab 03/19/17 0357 03/20/17 0414 03/22/17 0537  NA 142 144 137  K 4.4 4.5 4.4  CL 107 108 99*  CO2 _0 BUN _1 CREATININE 0.39* 0.35* <0.30*  GLUCOSE 182* 129* 132*    Electrolytes Recent Labs  Lab 03/18/17 0010 03/19/17 0357 03/20/17 0414 03/22/17 0537  CALCIUM 8.0* 8.1* 8.3* 7.9*  MG 1.8 2.0 2.0 2.0  PHOS 2.1* 1.9*  --  3.5    CBC Recent Labs  Lab 03/18/17 0010 03/20/17 0414 03/22/17 0537  WBC 12.8* 9.1 11.3*  HGB 11.2* 10.8* 11.4*  HCT 35.9* 34.0* 35.4*  PLT 251 197 203    Coag's Recent Labs  Lab 03/11/2017 1630 03/17/17 0340  APTT  --  28  INR  0.89 0.96    Sepsis Markers No results for input(s): LATICACIDVEN, PROCALCITON, O2SATVEN in the last 168 hours.  ABG Recent Labs  Lab 04/05/2017 2200 03/17/17 1253  PHART 7.387 7.402  PCO2ART 46.2 49.5*  PO2ART 113.0* 159.0*    Liver Enzymes Recent Labs  Lab 03/14/2017 1630  AST 30  ALT 30  ALKPHOS 69  BILITOT 1.3*  ALBUMIN 3.7    Cardiac Enzymes Recent Labs  Lab 03/30/2017 1630  TROPONINI <0.03    Glucose Recent Labs  Lab 03/21/17 1213 03/21/17 1547 03/21/17 1948 03/22/17 0034 03/22/17 0348 03/22/17 0818  GLUCAP 119* 117* 126* 133* 123* 146*     Imaging Dg Chest Port 1 View  Result Date: 03/22/2017 CLINICAL DATA:  Respiratory failure EXAM: PORTABLE CHEST 1 VIEW COMPARISON:  03/19/2017 FINDINGS: Endotracheal tube 6 cm from carina unchanged. Gastric 2 extends the stomach. Lungs are hyperinflated. There bilateral pleural effusions slightly increased. Upper lungs clear. RIGHT PICC line. IMPRESSION: 1. Stable support apparatus. 2. Mild increase in bilateral pleural effusions. 3. Hyperinflated lungs. Electronically Signed   By: Suzy Bouchard M.D.   On: 03/22/2017 07:37    STUDIES:  3/10  Cervical Spine CT >> pathologic fracture of C5 due to a large infiltrative mass that involves C5, C6, extends along the prevertebral soft tissues and encroaches upon the ventral epidural space.  This may reflect metastatic disease or a primary bony neoplasm.  3/10  CT Chest >> no acute intrathoracic, abdominal or pelvic pathology. No definiate malignancy or metastatic disease identified in the chest, abdomen or pelvis.  R lung subpleural nodular densities, likely areas of scarring  3/10  CT ABD/Pelvis >> thickened appearance of endometrium, indeterminate bilateral adrenal nodules 3/10  MRI Cervical Spine >> infiltrative mass in the C4, C5, C6 vertebrae which could be lymphoma, plasmacytoma or metastasis.   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  Large cystic / necrotic component which buldges into the prevertebral space.  Paravertebral cervical strain.  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 Workup concerning for infiltrative mass in C4, C5, C6 with pathologic fracture in C5,C6.  In addition, there is concern for possible adrenal nodules and thickened endometrium.    Pathology shows large B-cell lymphoma.  Continue to failure to thrive we will await family to make decisions on transition to comfort care.  ASSESSMENT / PLAN:  PULMONARY A: A acute respiratory failure due to inability to protect her airway P:   Currently stable on current vent setting She will need long-term care with tracheostomy and long-term vent support and snh=f Or transition to comfort care with terminal extubation  CARDIOVASCULAR A:  Hypotension - resolved  P:  Continue current treatment with IV fluids 2 feet  RENAL Lab Results  Component Value Date   CREATININE <0.30 (L) 03/22/2017   CREATININE 0.35 (L) 03/20/2017   CREATININE 0.39 (L) 03/19/2017   Recent Labs  Lab 03/19/17 0357 03/20/17 0414 03/22/17 0537  K 4.4 4.5 4.4    A:   No Acute Issues  P:   Monitor electrolytes   GASTROINTESTINAL A:   Aspiration Risk  P:   Continue  tube feedings  HEMATOLOGIC/oncology Recent Labs    03/20/17 0414 03/30/17 0537  HGB 10.8* 11.4*    A:   Leukocytosis  New diagnosis B-cell lymphoma P:  Follow CBC No treatment available at this time for B-cell lymphoma   INFECTIOUS A:   Probable left lower lobe aspiration pneumonia Anemia  P:   Day 6 of ceftriaxone Monitoring culture data on 30-Mar-2017 no positive culture data from blood cultures or BAL  ENDOCRINE A:   At Risk Hypo/Hyperglycemia    CBG (last 3)  Recent Labs    03/30/17 0034 03-30-2017 0348 March 30, 2017 0818  GLUCAP 133* 123* 146*    P:   CBGs and sliding scale  NEUROLOGIC A:   Chronic Pain - neck/back  Post spinal surgery for tumor 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  Explained her current status, prognosis, critical care issues.  I have recommended that based on her overall prognosis for meaningful recovery that we should consider a one-way extubation in the near future based on her ability to tolerate.  Sister indicates that she would like to come to [place] if possible from [place] and this may affect the timing of an extubation as well.  I have recommended against prolonged mechanical ventilation which would require tracheostomy and would commit her to paralysis, B-cell lymphoma, etc. until she dies.   Mar 30, 2017 no family at bedside.  Reported there is family in route to confer on CODE STATUS and withdrawal care. - Inter-disciplinary family meet or Palliative Care meeting due by: 3/21   NP critical care time 30 minutes   Richardson Landry Minor ACNP Maryanna Shape PCCM Pager [phone] till 1 pm If no answer page [phone] 03/30/2017, 10:15 AM

## 2017-03-22 NOTE — Progress Notes (Signed)
Pt.'s sister arrived from [place] about an hour ago and at [place] the pt. and pt.'s sister had come to the decision to withdraw from care, including terminal extubation. Pt. and family educated and stated they understood what extubation and comfort care meant and still wanted to proceed. At that time Dr. Lamonte Sakai was called and orders for full comfort care were given. Pt. now extubated with fentanyl gtt running. Will continue to monitor patient. Report given to night shift RN.

## 2017-03-22 NOTE — Progress Notes (Signed)
PCCM Interval Note  The patient's sister Alison Fisher has arrived to see her. I was able to speak w her by phone. She has communicated with her sister, is clear that she does not want to remain intubated. She wants to extubate now, transition to palliation.   I will place extubation orders now. Continue fentanyl gtt.   Baltazar Apo, MD, PhD 03/22/2017, 6:52 PM [facility] [phone] or if no answer [phone]

## 2017-03-23 MED ORDER — MORPHINE BOLUS VIA INFUSION
1.0000 mg | INTRAVENOUS | Status: DC | PRN
  Filled 2017-03-23: qty 1

## 2017-03-23 MED ORDER — LORAZEPAM 2 MG/ML IJ SOLN
INTRAMUSCULAR | Status: AC
  Filled 2017-03-23: qty 1

## 2017-03-23 MED ORDER — SODIUM CHLORIDE 0.9% FLUSH
10.0000 mL | Freq: Two times a day (BID) | INTRAVENOUS | Status: DC
  Administered 2017-03-24: 10 mL

## 2017-03-23 MED ORDER — SODIUM CHLORIDE 0.9 % IV SOLN
8.0000 mg/h | INTRAVENOUS | Status: DC
Start: 2017-03-23 — End: 2017-03-24
  Administered 2017-03-23: 8 mg/h via INTRAVENOUS
  Filled 2017-03-23 (×2): qty 10

## 2017-03-23 MED ORDER — SODIUM CHLORIDE 0.9% FLUSH: 10.0000 mL | INTRAVENOUS | Status: DC | PRN

## 2017-03-23 MED ORDER — ATROPINE SULFATE 1 % OP SOLN
2.0000 [drp] | Freq: Four times a day (QID) | OPHTHALMIC | Status: DC
  Administered 2017-03-23 – 2017-03-24 (×5): 2 [drp] via SUBLINGUAL
  Filled 2017-03-23: qty 2

## 2017-03-23 MED ORDER — WHITE PETROLATUM EX OINT
TOPICAL_OINTMENT | CUTANEOUS | Status: AC
  Administered 2017-03-23: 17:00:00
  Filled 2017-03-23: qty 28.35

## 2017-03-23 MED ORDER — CHLORHEXIDINE GLUCONATE CLOTH 2 % EX PADS
6.0000 | MEDICATED_PAD | Freq: Every day | CUTANEOUS | Status: DC
  Administered 2017-03-24: 6 via TOPICAL

## 2017-03-23 MED ORDER — LORAZEPAM 2 MG/ML IJ SOLN
1.0000 mg | INTRAMUSCULAR | Status: DC | PRN
  Administered 2017-03-23: 1 mg via INTRAVENOUS

## 2017-03-23 NOTE — Progress Notes (Signed)
Pt requested soothing music. Nurse paged me. Found a radio with a calming music cd in the [place] storage room. Pt said "that is wonderful." Pt said "afraid you are going to leave." Sat with pt until code blue pager went off. Returned after code blue. Nurses were getting pt comfortable. Pt was resting well when I left. I left the radio from the [place] storage room so pt could continue listening to "soothing music." Please return radio to [place] when pt no longer needs it.

## 2017-03-23 NOTE — Progress Notes (Signed)
During morning assessment, pt is notably dyspneic, has audible rhonchi. She c/o being SOB and in pain. NP Kary Kos made aware, new orders for atropine and morphine received.   Will monitor  Alison Fisher

## 2017-03-23 NOTE — Progress Notes (Signed)
PULMONARY / CRITICAL CARE MEDICINE   Name: Alison Fisher MRN: [id number] DOB: 06-02-1940    ADMISSION DATE:  04/02/2017 CONSULTATION DATE:  03/17/2017  REFERRING MD:  Dr. Maryland Pink  CHIEF COMPLAINT:  Weakness  HISTORY OF PRESENT ILLNESS:   77 year old female with PMH of tobacco abuse, suspected COPD, and chronic neck and back pain admitted to [facility] on 3/10 with weakness.    Patient with progressive neck and right arm weakness thought to be cervical radiculopathy treated with steroids.  Presented on 3/10 after she slipped off the couch and was unable to get up.  On arrival to [facility], she was unable to move any extremity spontaneously with diffuse numbess.  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  No known prior history of cancer. She was transferred to [facility] for Neurosurgery evaluation.  MRI confirmed CT findings with ventral epidural mass and C5 pathologic fracture with retropulsion causing spinal stenosis with cord compression treated with decadron and cervical stabilization. CT chest and abdomen raise concern for possible adrenal nodules and thickened endometrium.  She underwent C5 and C6 corpectomy for decompression with plating C4-7 on 3/11.  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  On 3/12, patient with ongoing hypoxia and increased O2 requirements.  SLP evaluation noted that patient was unable to manage any secretions, gurgling, and concerned if patient could protect her airway.  PCCM consulted for intubation.    SUBJECTIVE:  Complains of shortness of breath and generalized discomfort   VITAL SIGNS: BP 133/69   Pulse (!) 57   Temp 97.9 F (36.6 C) (Oral)   Resp 19   Ht 5' (1.524 m)   Wt 99 lb 3.3 oz (45 kg)   SpO2 94%   BMI 19.38 kg/m   HEMODYNAMICS:    VENTILATOR SETTINGS: Vent Mode: PRVC FiO2 (%):  [30 %] 30 % Set Rate:  [16 bmp] 16 bmp Vt Set:  [350 mL] 350 mL PEEP:  [5 cmH20] 5  cmH20 Plateau Pressure:  [14 cmH20-18 cmH20] 14 cmH20  INTAKE / OUTPUT: I/O last 3 completed shifts: In: 1880 [I.V.:865; NG/GT:805; IV Piggyback:210] Out: 1280 [Urine:1280]  PHYSICAL EXAMINATION: General: This is 77 year old female who is acutely ill.  She is having episodes of apnea at this point, but wakes up abruptly with marked dyspnea and anxiety HEENT: Normocephalic atraumatic.  She has very rhonchorous phonation, c-collar is not in place Pulmonary: Scattered rhonchi, periods of apnea witnessed at bedside.  Labored respiratory efforts at times. Cardiac: Regular rate and rhythm Abdomen: Soft nontender no organomegaly Extremities/muscular skeletal: Generalized weakness Neuro/psych: Awakens, interactive, generalized weakness.  LABS:  BMET Recent Labs  Lab 03/19/17 0357 03/20/17 0414 03/22/17 0537  NA 142 144 137  K 4.4 4.5 4.4  CL 107 108 99*  CO2 28 31 31   BUN 18 17 17   CREATININE 0.39* 0.35* <0.30*  GLUCOSE 182* 129* 132*    Electrolytes Recent Labs  Lab 03/18/17 0010 03/19/17 0357 03/20/17 0414 03/22/17 0537  CALCIUM 8.0* 8.1* 8.3* 7.9*  MG 1.8 2.0 2.0 2.0  PHOS 2.1* 1.9*  --  3.5    CBC Recent Labs  Lab 03/18/17 0010 03/20/17 0414 03/22/17 0537  WBC 12.8* 9.1 11.3*  HGB 11.2* 10.8* 11.4*  HCT 35.9* 34.0* 35.4*  PLT 251 197 203    Coag's Recent Labs  Lab 03/17/17 0340  APTT 28  INR 0.96    Sepsis Markers No results for input(s): LATICACIDVEN,  PROCALCITON, O2SATVEN in the last 168 hours.  ABG Recent Labs  Lab 03/17/17 1253  PHART 7.402  PCO2ART 49.5*  PO2ART 159.0*    Liver Enzymes No results for input(s): AST, ALT, ALKPHOS, BILITOT, ALBUMIN in the last 168 hours.  Cardiac Enzymes No results for input(s): TROPONINI, PROBNP in the last 168 hours.  Glucose Recent Labs  Lab 03/21/17 1948 03/22/17 0034 03/22/17 0348 03/22/17 0818 03/22/17 1232 03/22/17 1650  GLUCAP 126* 133* 123* 146* 118* 137*    Imaging No results  found.  STUDIES:  3/10  Cervical Spine CT >> pathologic fracture of C5 due to a large infiltrative mass that involves C5, C6, extends along the prevertebral soft tissues and encroaches upon the ventral epidural space.  This may reflect metastatic disease or a primary bony neoplasm.  3/10  CT Chest >> no acute intrathoracic, abdominal or pelvic pathology. No definiate malignancy or metastatic disease identified in the chest, abdomen or pelvis.  R lung subpleural nodular densities, likely areas of scarring  3/10  CT ABD/Pelvis >> thickened appearance of endometrium, indeterminate bilateral adrenal nodules 3/10  MRI Cervical Spine >> infiltrative mass in the C4, C5, C6 vertebrae which could be lymphoma, plasmacytoma or metastasis.   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  Large cystic / necrotic component which buldges into the prevertebral space.  Paravertebral cervical strain.  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 Workup concerning for infiltrative mass in C4, C5, C6 with pathologic fracture in C5,C6.  In addition, there is concern for possible adrenal nodules and  thickened endometrium.    Pathology shows large B-cell lymphoma.  Continue to failure to thrive we will await family to make decisions on transition to comfort care.  Extubated on 3/17 has been on fentanyl infusion, however awake, reports significant shortness of breath, as well as discomfort which appears to be  happening intermittently.  Her cough is very weak and ineffective she has copious oral secretions.  Plan Will add atropine oral drops change fentanyl to morphine and adjust for comfort We will hold off on transfer until we can be assured her symptoms are well controlled [place] ACNP-BC [place] Pager # [phone] OR # [phone] if no answer

## 2017-03-23 NOTE — Progress Notes (Signed)
Nutrition Brief Note  Chart reviewed.  Patient was extubated 3/17 per her wishes. Now transitioning to comfort care. Medical team is adjusting medications to ensure her comfort.  TF has been discontinued. No further nutrition interventions warranted at this time.  Please re-consult as needed.   Molli Barrows, RD, LDN, [place] Pager [phone] After Hours Pager [phone]

## 2017-03-23 NOTE — Progress Notes (Signed)
Chaplain on call Dillard Essex) requested that I check on patient if possible and that she really liked the music playing-relaxing. Patient able to say yes she wanted the music to play and yes that she wanted prayer.Had short visit-nurse in process of medicating her.  If chaplain needed to return, page chaplain to return. Thank you. Conard Novak, Chaplain    The CD [facility] box is from [facility].  When it is not needed, please call chaplain on call to pick it up. Thank you

## 2017-04-06 NOTE — Progress Notes (Signed)
PULMONARY / CRITICAL CARE MEDICINE   Name: Alison Fisher MRN: [id number] DOB: 09/27/1940    ADMISSION DATE:  04/02/2017 CONSULTATION DATE:  03/17/2017  REFERRING MD:  Dr. Maryland Pink  CHIEF COMPLAINT:  Weakness  HISTORY OF PRESENT ILLNESS:   77 year old female with PMH of tobacco abuse, suspected COPD, and chronic neck and back pain admitted to [facility] on 3/10 with weakness.    Patient with progressive neck and right arm weakness thought to be cervical radiculopathy treated with steroids.  Presented on 3/10 after she slipped off the couch and was unable to get up.  On arrival to [facility], she was unable to move any extremity spontaneously with diffuse numbess.  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  No known prior history of cancer. She was transferred to [facility] for Neurosurgery evaluation.  MRI confirmed CT findings with ventral epidural mass and C5 pathologic fracture with retropulsion causing spinal stenosis with cord compression treated with decadron and cervical stabilization. CT chest and abdomen raise concern for possible adrenal nodules and thickened endometrium.  She underwent C5 and C6 corpectomy for decompression with plating C4-7 on 3/11.  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  On 3/12, patient with ongoing hypoxia and increased O2 requirements.  SLP evaluation noted that patient was unable to manage any secretions, gurgling, and concerned if patient could protect her airway.  PCCM consulted for intubation.    SUBJECTIVE:  Sedated on morphine gtt   VITAL SIGNS: BP (!) 62/39 (BP Location: Left Arm)   Pulse 62   Temp 99 F (37.2 C) (Axillary)   Resp 13   Ht 5' (1.524 m)   Wt 99 lb 3.3 oz (45 kg)   SpO2 93%   BMI 19.38 kg/m   HEMODYNAMICS:    VENTILATOR SETTINGS:    INTAKE / OUTPUT: I/O last 3 completed shifts: In: 279 [I.V.:279] Out: 1430 [Urine:1430]  PHYSICAL  EXAMINATION:  General: This is a 77 year old white female she is currently unresponsive on morphine infusion. HEENT: Normocephalic, mucous membranes dry, c-collar in place.  Rhonchorous upper airway noises. Pulmonary: Respiratory rate approximately 12, appears comfortable, no accessory use, intermittent episodes of apnea noted. Cardiac: Regular rate and rhythm Abdomen: Soft nontender Extremities/musculoskeletal: Warm, dry, Neuro: Appears comfortable unresponsive. LABS:  BMET Recent Labs  Lab 03/19/17 0357 03/20/17 0414 03/22/17 0537  NA 142 144 137  K 4.4 4.5 4.4  CL 107 108 99*  CO2 28 31 31   BUN 18 17 17   CREATININE 0.39* 0.35* <0.30*  GLUCOSE 182* 129* 132*    Electrolytes Recent Labs  Lab 03/18/17 0010 03/19/17 0357 03/20/17 0414 03/22/17 0537  CALCIUM 8.0* 8.1* 8.3* 7.9*  MG 1.8 2.0 2.0 2.0  PHOS 2.1* 1.9*  --  3.5    CBC Recent Labs  Lab 03/18/17 0010 03/20/17 0414 03/22/17 0537  WBC 12.8* 9.1 11.3*  HGB 11.2* 10.8* 11.4*  HCT 35.9* 34.0* 35.4*  PLT 251 197 203    Coag's No results for input(s): APTT, INR in the last 168 hours.  Sepsis Markers No results for input(s): LATICACIDVEN, PROCALCITON, O2SATVEN in the last 168 hours.  ABG Recent Labs  Lab 03/17/17 1253  PHART 7.402  PCO2ART 49.5*  PO2ART 159.0*    Liver Enzymes No results for input(s): AST, ALT, ALKPHOS, BILITOT, ALBUMIN in the last 168 hours.  Cardiac Enzymes No results for input(s): TROPONINI, PROBNP in the last 168 hours.  Glucose Recent Labs  Lab 03/21/17 1948 03/22/17 0034 03/22/17 0348 03/22/17 0818 03/22/17 1232 03/22/17 1650  GLUCAP 126* 133* 123* 146* 118* 137*    Imaging No results found.  STUDIES:  3/10  Cervical Spine CT >> pathologic fracture of C5 due to a large infiltrative mass that involves C5, C6, extends along the prevertebral soft tissues and encroaches upon the ventral epidural space.  This may reflect metastatic disease or a primary bony  neoplasm.  3/10  CT Chest >> no acute intrathoracic, abdominal or pelvic pathology. No definiate malignancy or metastatic disease identified in the chest, abdomen or pelvis.  R lung subpleural nodular densities, likely areas of scarring  3/10  CT ABD/Pelvis >> thickened appearance of endometrium, indeterminate bilateral adrenal nodules 3/10  MRI Cervical Spine >> infiltrative mass in the C4, C5, C6 vertebrae which could be lymphoma, plasmacytoma or metastasis.   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  Large cystic / necrotic component which buldges into the prevertebral space.  Paravertebral cervical strain.  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 Workup concerning for infiltrative mass in C4, C5, C6 with pathologic fracture in C5,C6.  In addition, there is concern for possible adrenal nodules and thickened endometrium.    Pathology shows large B-cell lymphoma.  Continue to failure to thrive we will await family to make decisions on transition to comfort care.  Extubated on 3/17.  She is been on a morphine infusion since 3/18.  She has declined over the evening,  now desaturating, hypotensive, and unresponsive.  She does appear comfortable.  I believe she is actively dying.  Plan Continue morphine infusion PRN Ativan PRN atropine oral drops for oral secretions Continue supportive care   Erick Colace ACNP-BC [place] Pager # [phone] OR # [phone] if no answer

## 2017-04-06 NOTE — Progress Notes (Signed)
30 mL of Morphine wasted, Sindy Messing, RN as a witness.

## 2017-04-06 NOTE — Progress Notes (Signed)
Pt was pronounced 1018 with Arvil Chaco.

## 2017-04-06 DEATH — deceased

## 2017-05-06 NOTE — Death Summary Note (Signed)
DEATH SUMMARY   Patient Details  Name: Alison Fisher MRN: [id number] DOB: 01-24-1940  Admission/Discharge Information   Admit Date:  April 04, 2017  Date of Death: Date of Death: Apr 13, 2017  Time of Death: Time of Death: 23-Apr-1016  Length of Stay: 04/04/2022  Referring Physician: Patient, No Pcp Per   Reason(s) for Hospitalization  Pathological fracture and Fall  Diagnoses  Preliminary cause of death:  Secondary Diagnoses (including complications and co-morbidities):  Principal Problem:   Pathologic compression fracture of cervical vertebra, initial encounter [facility]) Active Problems:   Acute encephalopathy   Quadriplegia and quadriparesis ([place])   Neck mass   Pressure injury of skin   Acute respiratory failure with hypoxia [facility])   [place] [facility] Course (including significant findings, care, treatment, and services provided and events leading to death)  Alison Fisher is a 77 y.o. year old female who who was found down at home after slipping on the floor and being unable to get up.  She was found to be paraplegic.  She was found to have a pathologic fracture of the C-spine with a C-spine mass with cord impingement.  Underwent neurosurgical debridement and cord repair.  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  Given the poor prognosis for her lymphoma as well as her ongoing paraplegia it was decided that a one-way extubation with comfort measures to follow was the most compassionate course of action.  She was extubated.  Appropriate analgesia was ensured.  The patient was not able to maintain adequate spontaneous respirations and passed away comfortably at 1018 on 04-13-2017.  No autopsy or medical examiner referral was made.  Pertinent Labs and Studies  Significant Diagnostic Studies Dg Cervical Spine 2-3 Views  Result Date: 03/20/2017 CLINICAL DATA:  Tumor of the cervical spine compressing the cervical spinal cord. Pathologic destruction the C5 and C6 vertebral bodies.  EXAM: DG C-ARM 61-120 MIN; CERVICAL SPINE - 2-3 VIEW COMPARISON:  CT scan MRI dated 04-04-2017 FINDINGS: AP and lateral views of the cervical spine demonstrate the patient has undergone corpectomy at C5 and C6 with anterior fusion from C4 to C7. Titanium expandable cage placed. Hardware appears in good position in the AP and lateral projections. IMPRESSION: Corpectomy with anterior fusion from C4-C7 with cage insertion. FLUOROSCOPY TIME:  C-arm fluoroscopic images were obtained intraoperatively and submitted for post operative interpretation. Please see the performing provider's procedural report for the fluoroscopy time utilized. Electronically Signed   By: Lorriane Shire M.D.   On: 03/31/2017 17:45   Ct Chest W Contrast  Result Date: 04/03/2017 CLINICAL DATA:  77 year old female with C-spine mass and associated pathologic fracture seen on earlier CT and MRI. EXAM: CT CHEST, ABDOMEN, AND PELVIS WITH CONTRAST TECHNIQUE: Multidetector CT imaging of the chest, abdomen and pelvis was performed following the standard protocol during bolus administration of intravenous contrast. CONTRAST:  140mL ISOVUE-300 IOPAMIDOL (ISOVUE-300) INJECTION 61% COMPARISON:  C-spine CT dated 04/04/2017 an MRI dated 04-04-2017 FINDINGS: CT CHEST FINDINGS Cardiovascular: There is no cardiomegaly or pericardial effusion. Multi vessel coronary vascular calcification noted. There is moderate atherosclerotic calcification of the thoracic aorta. No aneurysmal dilatation or evidence of dissection. The visualized origins of the great vessels of the aortic arch appear patent. The central pulmonary arteries are grossly unremarkable as visualized. Mediastinum/Nodes: There is no hilar or mediastinal adenopathy. The esophagus and the thyroid gland are grossly unremarkable. No mediastinal fluid collection. Lungs/Pleura: There are bibasilar linear atelectasis/scarring. Probable trace left pleural effusion and left posterior pleural base thickening.  A  10 x 13 mm focal subpleural nodular density in the right lower lobe (series 4, image 103) may represent an area of scarring. A 6 x 10 mm subpleural nodular density in the right upper lobe (series 4, image 39) may also represent an area of scarring. Other etiologies are not excluded. There is no focal consolidation, pleural effusion, or pneumothorax. Mucus content noted in the trachea. The central airways are patent. Musculoskeletal: Partially visualized destructive lytic mass along the anterior lower cervical spine better evaluated on the prior CT and MRI. There is osteopenia with degenerative changes of the spine. No acute osseous pathology of the thoracic cage. CT ABDOMEN PELVIS FINDINGS No intra-abdominal free air or free fluid. Hepatobiliary: The liver is unremarkable. No intrahepatic biliary ductal dilatation. The gallbladder is unremarkable. Pancreas: Unremarkable. No pancreatic ductal dilatation or surrounding inflammatory changes. Spleen: Normal in size without focal abnormality. Adrenals/Urinary Tract: There is an indeterminate 10 mm right adrenal nodule as well as a 7 mm left adrenal nodule. MRI may provide better characterisation. The kidneys are unremarkable. There is uniform enhancement and symmetric excretion of contrast by both kidneys. The visualized ureters and urinary bladder appear unremarkable. Stomach/Bowel: There is extensive sigmoid diverticulosis without active inflammatory changes. There is no bowel obstruction or active inflammation. The appendix is normal. Vascular/Lymphatic: There is advanced aortoiliac atherosclerotic disease. No portal venous gas. There is no adenopathy. Reproductive: The uterus is somewhat atrophic. There is thickened appearance of the endometrium measuring approximately 13 mm. Further evaluation with pelvic ultrasound recommended. The ovaries are grossly unremarkable. Other: None Musculoskeletal: Osteopenia with degenerative changes of the spine. A 12 mm sclerotic  focus in the upper sacrum, likely a bone island. No acute osseous pathology. IMPRESSION: 1. No acute intrathoracic, abdominal, or pelvic pathology. No definite malignancy or metastatic disease identified in the chest, abdomen, or pelvis. 2. Right lung subpleural nodular densities as described, likely areas of scarring. Clinical correlation is recommended. 3. Thickened appearance of the endometrium. Further evaluation with pelvic ultrasound recommended. 4. Partially visualized lower cervical spine destructive mass, better seen and evaluated on the prior CT and MRI. 5. Indeterminate bilateral adrenal nodules. MRI may provide better characterisation. 6. Nonacute findings as described above. Electronically Signed   By: Anner Crete M.D.   On: 03/10/2017 00:03   Ct Cervical Spine Wo Contrast  Result Date: 04/04/2017 CLINICAL DATA:  Cervical fracture. EXAM: CT CERVICAL SPINE WITHOUT CONTRAST TECHNIQUE: Multidetector CT imaging of the cervical spine was performed without intravenous contrast. Multiplanar CT image reconstructions were also generated. COMPARISON:  MRI of the cervical spine, 03/10/2017 FINDINGS: Alignment: Mild kyphosis centered at C5-C6.  No spondylolisthesis. Skull base and vertebrae: There is a lytic destructive lesion extending throughout most of the C5 and C6 vertebral bodies. There is marked loss of disc height at this level. Small lucencies are noted in the C7 vertebra. The lytic destructive lesion at C5-C6 has a soft tissue component that bulges into the prevertebral soft tissues elevating the overlying pharynx and larynx. The soft tissue mass including the bone lesion component, measures approximately 5.2 x 2.9 x 3.4 cm. There is moderate loss of height of the C5 vertebra with mild loss of height of the C6 vertebra, along with the loss of disc height. Soft tissues and spinal canal: There is posterior bulging of the C5 vertebra encroaching upon the spinal canal, better evaluated on the current  MRI. Disc levels: Moderate loss of disc height is noted at C6-C7, which appears degenerative. Upper chest: No  other soft tissue masses. No discrete enlarged lymph nodes. Lung apices are clear. Other: None. IMPRESSION: 1. Pathologic fracture of C5 due to a large infiltrative mass that involves C5 and C6, extends along the prevertebral soft tissues and encroaches upon the ventral epidural space, better assessed on the current cervical MRI. This may reflect metastatic disease or a primary bone neoplasm such as lymphoma. Electronically Signed   By: Lajean Manes M.D.   On: 04/04/2017 21:33   Mr Cervical Spine W Wo Contrast  Result Date: 03/30/2017 CLINICAL DATA:  Generalized weakness. EXAM: MRI CERVICAL SPINE WITHOUT AND WITH CONTRAST TECHNIQUE: Multiplanar and multiecho pulse sequences of the cervical spine, to include the craniocervical junction and cervicothoracic junction, were obtained without and with intravenous contrast. CONTRAST:  59mL MULTIHANCE GADOBENATE DIMEGLUMINE 529 MG/ML IV SOLN COMPARISON:  None. FINDINGS: Alignment: No listhesis. Vertebrae: Infiltrative enhancing mass throughout the C5 and C6 bodies, also invading the anterior C4 body. The mass is primarily enhancing, but there is a large ventral cystic component bulging into the prevertebral space where there is also ill-defined fluid signal. This cystic component measures up to 4.2 cm craniocaudal. The C5 body is partially nonenhancing and has collapsed with ventral epidural mass and bone retropulsion impinging on the cord which is mildly edematous. Both C4-5 and C5-6 foramina are likely infiltrated by mass. This has the appearance of malignancy rather than infection. Cord: Cord compression at C5, with T2 hyperintensity. Posterior Fossa, vertebral arteries, paraspinal tissues: Prevertebral mass as above. There is paraspinal edema symmetrically in the midcervical region, presumably related to patient's recent fall. Disc levels: C2-3: Facet  spurring and ligamentum flavum buckling.  No impingement C3-4: Facet spurring and small central disc protrusion. Patent canal and foramina. C4-5: Cord compression by mass and C5 pathologic fracture retropulsion. C5-6: Cord compression by mass and C5 pathologic fracture retropulsion. C6-7: Unremarkable for age.  No impingement. C7-T1:Unremarkable for age.  No impingement. Dr. Regenia Skeeter is already aware of the findings at time of dictation. IMPRESSION: 1. 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 Large cystic/necrotic component which bulges into the prevertebral space. 2. Paravertebral cervical strain. CT would be complementary in excluding a posterior element fracture. Electronically Signed   By: Monte Fantasia M.D.   On: 03/25/2017 18:59   Ct Abdomen Pelvis W Contrast  Result Date: 03/18/2017 CLINICAL DATA:  77 year old female with C-spine mass and associated pathologic fracture seen on earlier CT and MRI. EXAM: CT CHEST, ABDOMEN, AND PELVIS WITH CONTRAST TECHNIQUE: Multidetector CT imaging of the chest, abdomen and pelvis was performed following the standard protocol during bolus administration of intravenous contrast. CONTRAST:  167mL ISOVUE-300 IOPAMIDOL (ISOVUE-300) INJECTION 61% COMPARISON:  C-spine CT dated 03/23/2017 an MRI dated 04/05/2017 FINDINGS: CT CHEST FINDINGS Cardiovascular: There is no cardiomegaly or pericardial effusion. Multi vessel coronary vascular calcification noted. There is moderate atherosclerotic calcification of the thoracic aorta. No aneurysmal dilatation or evidence of dissection. The visualized origins of the great vessels of the aortic arch appear patent. The central pulmonary arteries are grossly unremarkable as visualized. Mediastinum/Nodes: There is no hilar or mediastinal  adenopathy. The esophagus and the thyroid gland are grossly unremarkable. No mediastinal fluid collection. Lungs/Pleura: There are bibasilar linear atelectasis/scarring. Probable trace left pleural effusion and left posterior pleural base thickening. A 10 x 13 mm focal subpleural nodular density in the right  lower lobe (series 4, image 103) may represent an area of scarring. A 6 x 10 mm subpleural nodular density in the right upper lobe (series 4, image 39) may also represent an area of scarring. Other etiologies are not excluded. There is no focal consolidation, pleural effusion, or pneumothorax. Mucus content noted in the trachea. The central airways are patent. Musculoskeletal: Partially visualized destructive lytic mass along the anterior lower cervical spine better evaluated on the prior CT and MRI. There is osteopenia with degenerative changes of the spine. No acute osseous pathology of the thoracic cage. CT ABDOMEN PELVIS FINDINGS No intra-abdominal free air or free fluid. Hepatobiliary: The liver is unremarkable. No intrahepatic biliary ductal dilatation. The gallbladder is unremarkable. Pancreas: Unremarkable. No pancreatic ductal dilatation or surrounding inflammatory changes. Spleen: Normal in size without focal abnormality. Adrenals/Urinary Tract: There is an indeterminate 10 mm right adrenal nodule as well as a 7 mm left adrenal nodule. MRI may provide better characterisation. The kidneys are unremarkable. There is uniform enhancement and symmetric excretion of contrast by both kidneys. The visualized ureters and urinary bladder appear unremarkable. Stomach/Bowel: There is extensive sigmoid diverticulosis without active inflammatory changes. There is no bowel obstruction or active inflammation. The appendix is normal. Vascular/Lymphatic: There is advanced aortoiliac atherosclerotic disease. No portal venous gas. There is no adenopathy. Reproductive: The uterus is somewhat atrophic. There is thickened  appearance of the endometrium measuring approximately 13 mm. Further evaluation with pelvic ultrasound recommended. The ovaries are grossly unremarkable. Other: None Musculoskeletal: Osteopenia with degenerative changes of the spine. A 12 mm sclerotic focus in the upper sacrum, likely a bone island. No acute osseous pathology. IMPRESSION: 1. No acute intrathoracic, abdominal, or pelvic pathology. No definite malignancy or metastatic disease identified in the chest, abdomen, or pelvis. 2. Right lung subpleural nodular densities as described, likely areas of scarring. Clinical correlation is recommended. 3. Thickened appearance of the endometrium. Further evaluation with pelvic ultrasound recommended. 4. Partially visualized lower cervical spine destructive mass, better seen and evaluated on the prior CT and MRI. 5. Indeterminate bilateral adrenal nodules. MRI may provide better characterisation. 6. Nonacute findings as described above. Electronically Signed   By: Anner Crete M.D.   On: 03/12/2017 00:03   Dg Chest Port 1 View  Result Date: 03/22/2017 CLINICAL DATA:  Respiratory failure EXAM: PORTABLE CHEST 1 VIEW COMPARISON:  03/19/2017 FINDINGS: Endotracheal tube 6 cm from carina unchanged. Gastric 2 extends the stomach. Lungs are hyperinflated. There bilateral pleural effusions slightly increased. Upper lungs clear. RIGHT PICC line. IMPRESSION: 1. Stable support apparatus. 2. Mild increase in bilateral pleural effusions. 3. Hyperinflated lungs. Electronically Signed   By: Suzy Bouchard M.D.   On: 03/22/2017 07:37   Dg Chest Port 1 View  Result Date: 03/19/2017 CLINICAL DATA:  Respiratory failure. EXAM: PORTABLE CHEST 1 VIEW COMPARISON:  03/18/2017. FINDINGS: Endotracheal tube, NG tube, right PICC line in stable position. Heart size stable. Persistent left lung infiltrate with left-sided pleural effusion. This small right pleural effusion cannot be excluded. No interim change. Prior cervical spine  fusion. IMPRESSION: 1.  Lines and tubes in stable position. 2. Persistent left lung infiltrate with left-sided pleural effusion. Small right pleural effusions may also be present. Similar findings noted on prior chest x-ray. Electronically Signed   By: Marcello Moores  Register   On: 03/19/2017 09:05   Dg Chest Port 1 View  Result Date: 03/18/2017 CLINICAL DATA:  Acute respiratory failure with hypoxia EXAM: PORTABLE CHEST 1 VIEW COMPARISON:  Yesterday FINDINGS: Right upper extremity  PICC with tip at the upper SVC. Endotracheal tube tip between the clavicular heads and carina. An orogastric tube at least reaches the stomach. Significantly improved aeration at the left base. No generalized Kerley lines. Probable small left effusion. No pneumothorax. IMPRESSION: 1. Improved left lower lobe aeration after bronchoscopy yesterday. 2. Probable small left effusion. 3. New right upper extremity PICC in good position. 4. Interval uncoiling of the nasogastric tube. Electronically Signed   By: Monte Fantasia M.D.   On: 03/18/2017 07:41   Dg Chest Portable 1 View  Result Date: 03/17/2017 CLINICAL DATA:  Endotracheal tube placement.  OG tube placement. EXAM: PORTABLE CHEST 1 VIEW COMPARISON:  CT 04/05/2017. FINDINGS: Endotracheal tube tip 5 cm above the carina. Orogastric tube appears to be coiled in the stomach with its tip coiled back into the lower esophagus, repositioning suggested. Atelectasis left lower lobe with left-sided pleural effusion. No pneumothorax. Prior cervical spine fusion. Thoracic spine degenerative change. IMPRESSION: 1.  Endotracheal tube tip noted 5 cm above the carina. 2. Orogastric tube noted coiled in the stomach with its distal tip coiled back into the lower esophagus. Repositioning suggested. 3.  Left lower lobe atelectasis with left-sided pleural effusion. Critical Value/emergent results were called by telephone at the time of interpretation on 03/17/2017 at 1:19 pm to nurse Threasa Beards, who verbally  acknowledged these results. Electronically Signed   By: Marcello Moores  Register   On: 03/17/2017 13:20   Dg Abd Portable 1v  Result Date: 03/17/2017 CLINICAL DATA:  Orogastric tube placement EXAM: PORTABLE ABDOMEN - 1 VIEW COMPARISON:  03/17/2017 FINDINGS: Gastric tube has been adjusted with the tip now in the body the stomach. Bowel decompressed without obstruction or ileus Left lower lobe consolidation and left effusion IMPRESSION: Gastric tube now in the body the stomach.  Normal bowel gas pattern Left lower lobe consolidation. Electronically Signed   By: Franchot Gallo M.D.   On: 03/17/2017 13:49   Dg Abd Portable 1v  Result Date: 03/17/2017 CLINICAL DATA:  Orogastric tube placement. EXAM: PORTABLE ABDOMEN - 1 VIEW COMPARISON:  None. FINDINGS: The bowel gas pattern is normal. No radio-opaque calculi or other significant radiographic abnormality are seen. Orogastric tube is seen entering stomach, but tip looping back into the distal esophagus. IMPRESSION: Orogastric tube is seen looped within proximal stomach, but distal tip is directed back into the distal esophagus. Electronically Signed   By: Marijo Conception, M.D.   On: 03/17/2017 13:20   Dg C-arm 1-60 Min  Result Date: 03/10/2017 CLINICAL DATA:  Tumor of the cervical spine compressing the cervical spinal cord. Pathologic destruction the C5 and C6 vertebral bodies. EXAM: DG C-ARM 61-120 MIN; CERVICAL SPINE - 2-3 VIEW COMPARISON:  CT scan MRI dated 03/27/2017 FINDINGS: AP and lateral views of the cervical spine demonstrate the patient has undergone corpectomy at C5 and C6 with anterior fusion from C4 to C7. Titanium expandable cage placed. Hardware appears in good position in the AP and lateral projections. IMPRESSION: Corpectomy with anterior fusion from C4-C7 with cage insertion. FLUOROSCOPY TIME:  C-arm fluoroscopic images were obtained intraoperatively and submitted for post operative interpretation. Please see the performing provider's procedural  report for the fluoroscopy time utilized. Electronically Signed   By: Lorriane Shire M.D.   On: 03/12/2017 17:45   [place] Ekg Site Rite  Result Date: 03/17/2017 If Site Rite image not attached, placement could not be confirmed due to current cardiac rhythm.   Microbiology No results found for this or any previous visit (from the  past 240 hour(s)).  Lab Basic Metabolic Panel: No results for input(s): NA, K, CL, CO2, GLUCOSE, BUN, CREATININE, CALCIUM, MG, PHOS in the last 168 hours. Liver Function Tests: No results for input(s): AST, ALT, ALKPHOS, BILITOT, PROT, ALBUMIN in the last 168 hours. No results for input(s): LIPASE, AMYLASE in the last 168 hours. No results for input(s): AMMONIA in the last 168 hours. CBC: No results for input(s): WBC, NEUTROABS, HGB, HCT, MCV, PLT in the last 168 hours. Cardiac Enzymes: No results for input(s): CKTOTAL, CKMB, CKMBINDEX, TROPONINI in the last 168 hours. Sepsis Labs: No results for input(s): PROCALCITON, WBC, LATICACIDVEN in the last 168 hours.  Procedures/Operations  1. Corpectomy (>50%) at C5 and C6 for decompression of spinal cord and exiting nerve roots  2. Placement of intervertebral biomechanical device, [employer] titanium expandable cage 3. Placement of anterior instrumentation consisting of interbody plate and screws spanning C4-C7 4. Use of morselized bone allograft  5. Arthrodesis C4-C7, anterior interbody technique  6. Use of intraoperative microscope 7.  Mechanical ventilation    Alison Fisher 04/06/2017, 3:21 PM

## 2019-11-27 IMAGING — CT CT CERVICAL SPINE W/O CM
3 series · 13 of 33 positions shown, 16 images · non-contrast
Comparison: MRI of the cervical spine, 03/15/2017

CLINICAL DATA: Cervical fracture.

EXAM:
CT CERVICAL SPINE WITHOUT CONTRAST
TECHNIQUE: Multidetector CT imaging of the cervical spine was performed without
intravenous contrast. Multiplanar CT image reconstructions were also
generated.

[Series 5: c spine soft · axial · 0.36mm/px · z∈[+1020,+1120]mm · 5 of 74 slices shown, 7 images]
[im 12/74  soft-tissue]
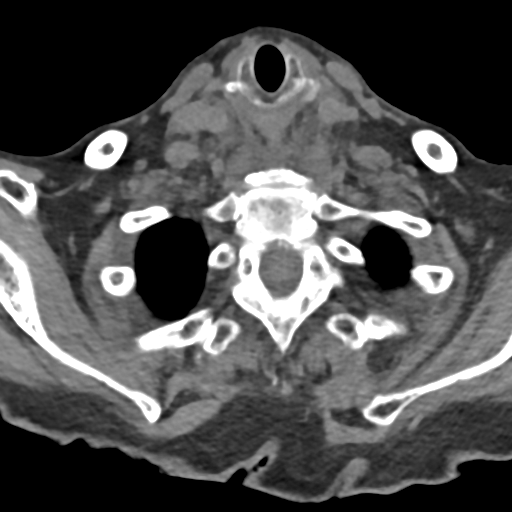
[im 12/74  bone]
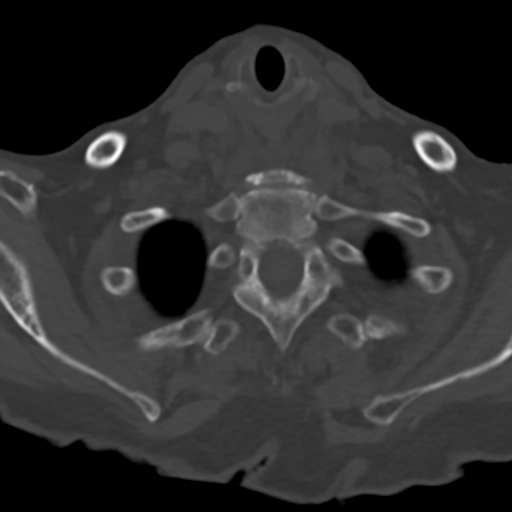
[im 23/74  bone]
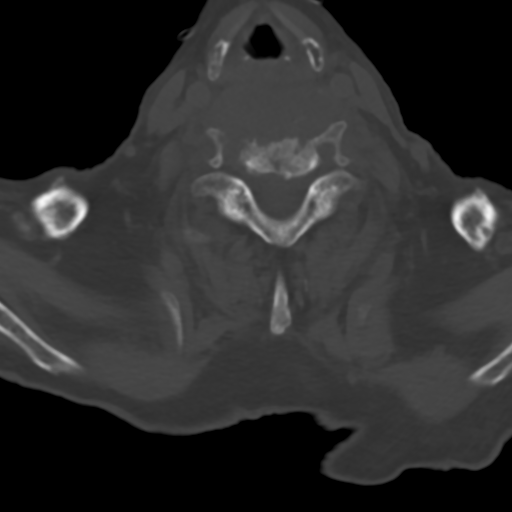
[im 40/74  bone]
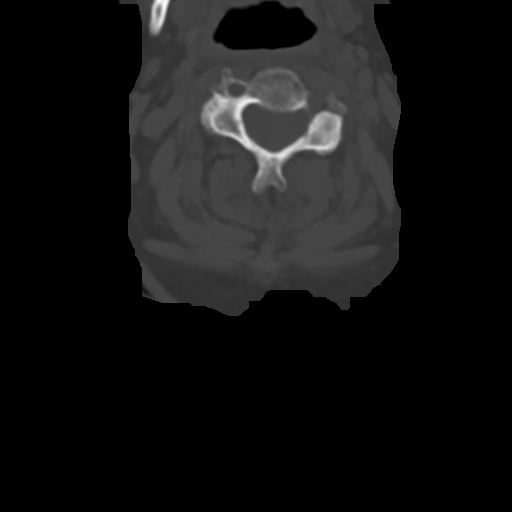
[im 51/74  bone]
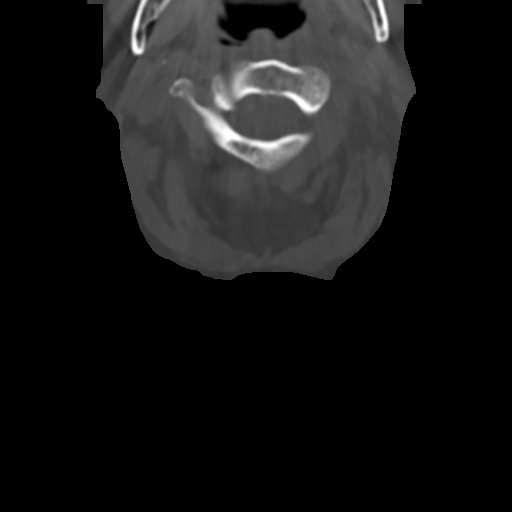
[im 62/74  soft-tissue]
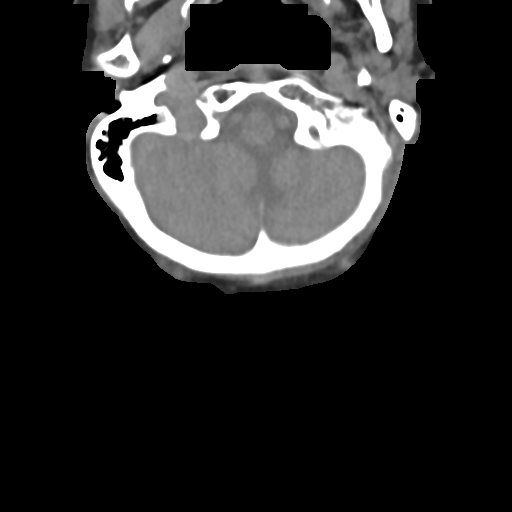
[im 62/74  bone]
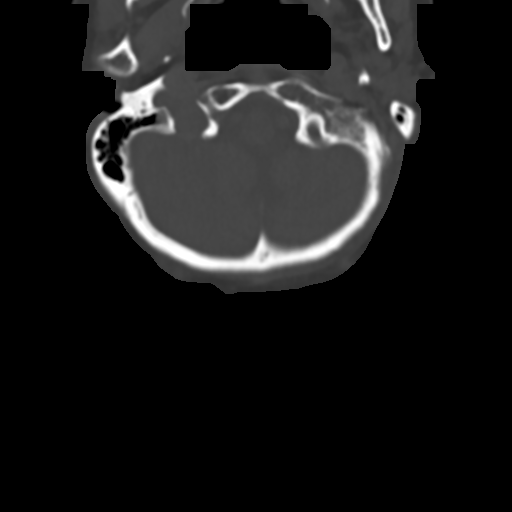

[Series 7: sag bone · sagittal · 0.29mm/px · 5 of 41 slices shown, 6 images]
[im 14/41  bone]
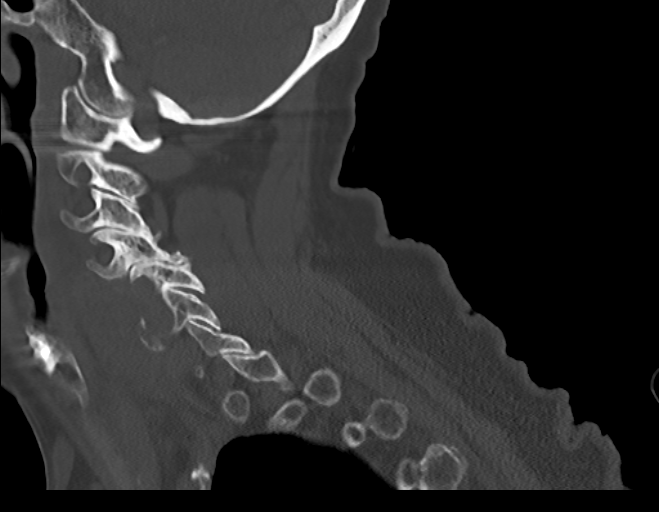
[im 17/41  bone]
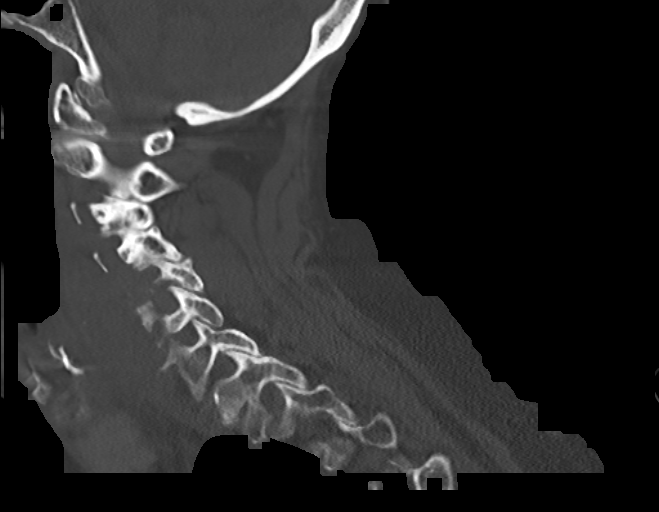
[im 21/41  soft-tissue]
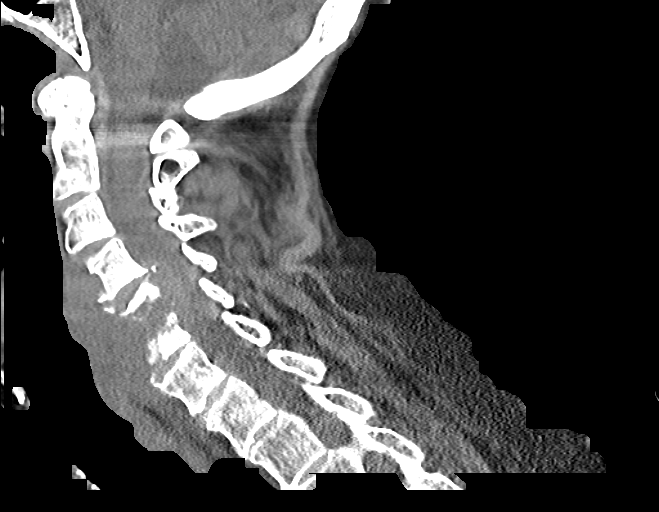
[im 21/41  bone]
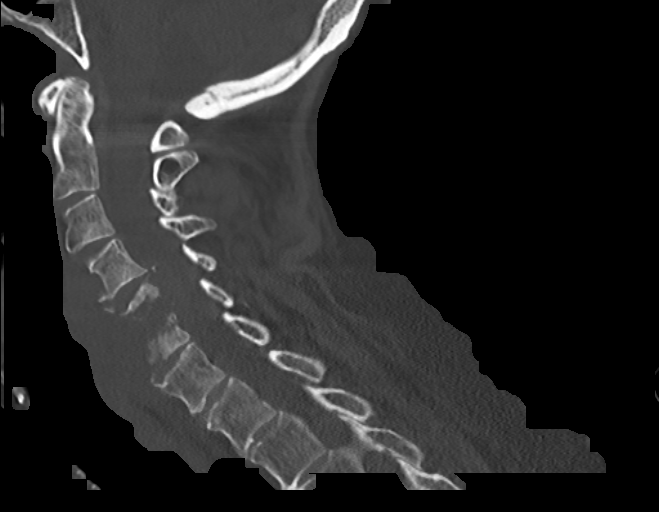
[im 24/41  bone]
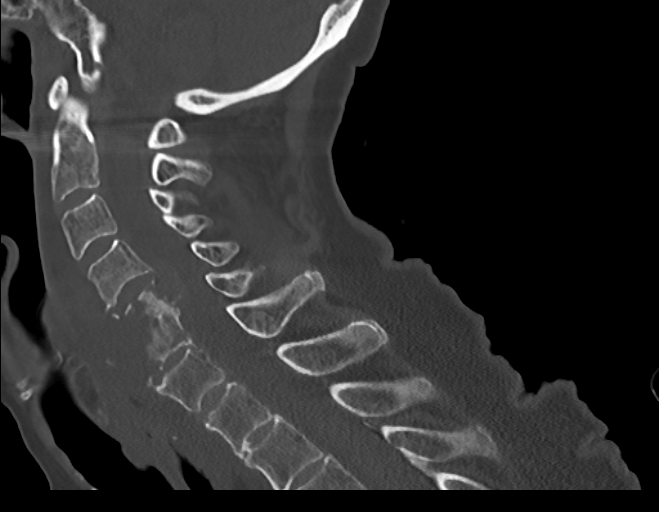
[im 27/41  bone]
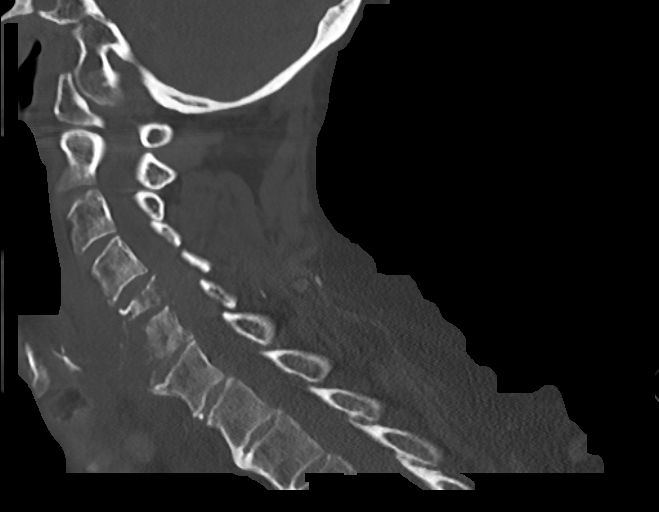

[Series 8: cor bone · coronal · 0.25mm/px · 3 of 51 slices shown]
[im 11/51  bone]
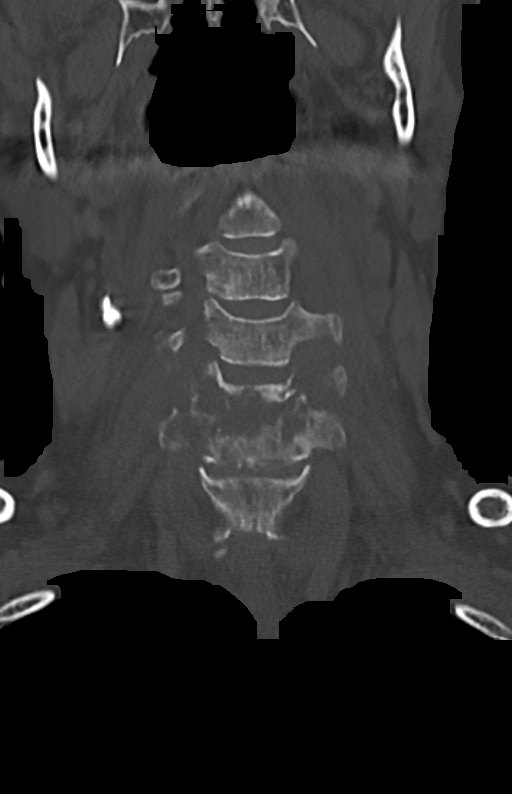
[im 21/51  bone]
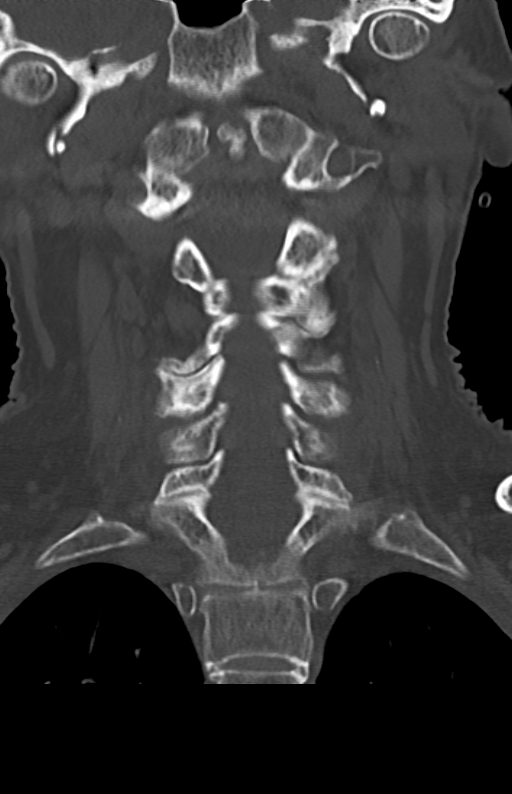
[im 31/51  bone]
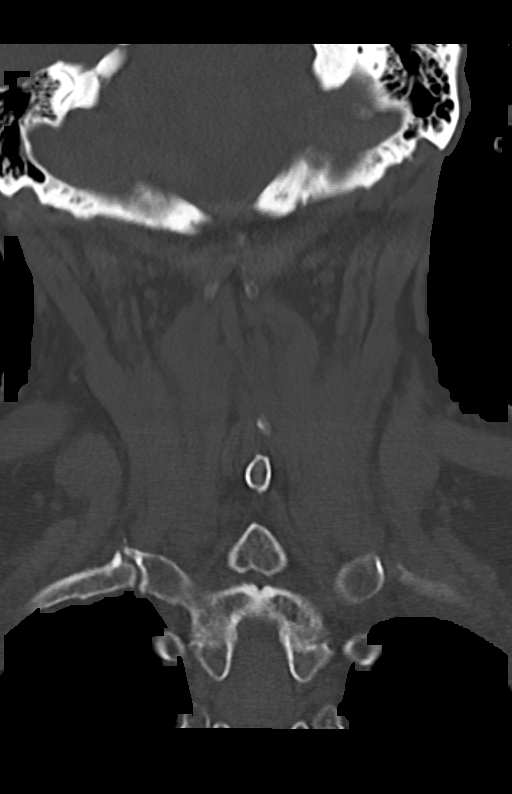

[13 of 33 positions shown; findings below may reference images not displayed]

FINDINGS: Alignment: Mild kyphosis centered at C5-C6.  No spondylolisthesis.

Skull base and vertebrae: There is a lytic destructive lesion
extending throughout most of the C5 and C6 vertebral bodies. There
is marked loss of disc height at this level. Small lucencies are
noted in the C7 vertebra. The lytic destructive lesion at C5-C6 has
a soft tissue component that bulges into the prevertebral soft
tissues elevating the overlying pharynx and larynx. The soft tissue
mass including the bone lesion component, measures approximately
x 2.9 x 3.4 cm. There is moderate loss of height of the C5 vertebra
with mild loss of height of the C6 vertebra, along with the loss of
disc height.

Soft tissues and spinal canal: There is posterior bulging of the C5
vertebra encroaching upon the spinal canal, better evaluated on the
current MRI.

Disc levels: Moderate loss of disc height is noted at C6-C7, which
appears degenerative.

Upper chest: No other soft tissue masses. No discrete enlarged lymph
nodes. Lung apices are clear.

Other: None.
IMPRESSION: 1. Pathologic fracture of C5 due to a large infiltrative mass that
involves C5 and C6, extends along the prevertebral soft tissues and
encroaches upon the ventral epidural space, better assessed on the
current cervical MRI. This may reflect metastatic disease or a
primary bone neoplasm such as lymphoma.

## 2019-11-29 IMAGING — DX DG CHEST 1V PORT
1 series · 1 of 1 positions shown · non-contrast
Comparison: CT 03/15/2017.

CLINICAL DATA: Endotracheal tube placement.  OG tube placement.

EXAM:
PORTABLE CHEST 1 VIEW

[chest ap]
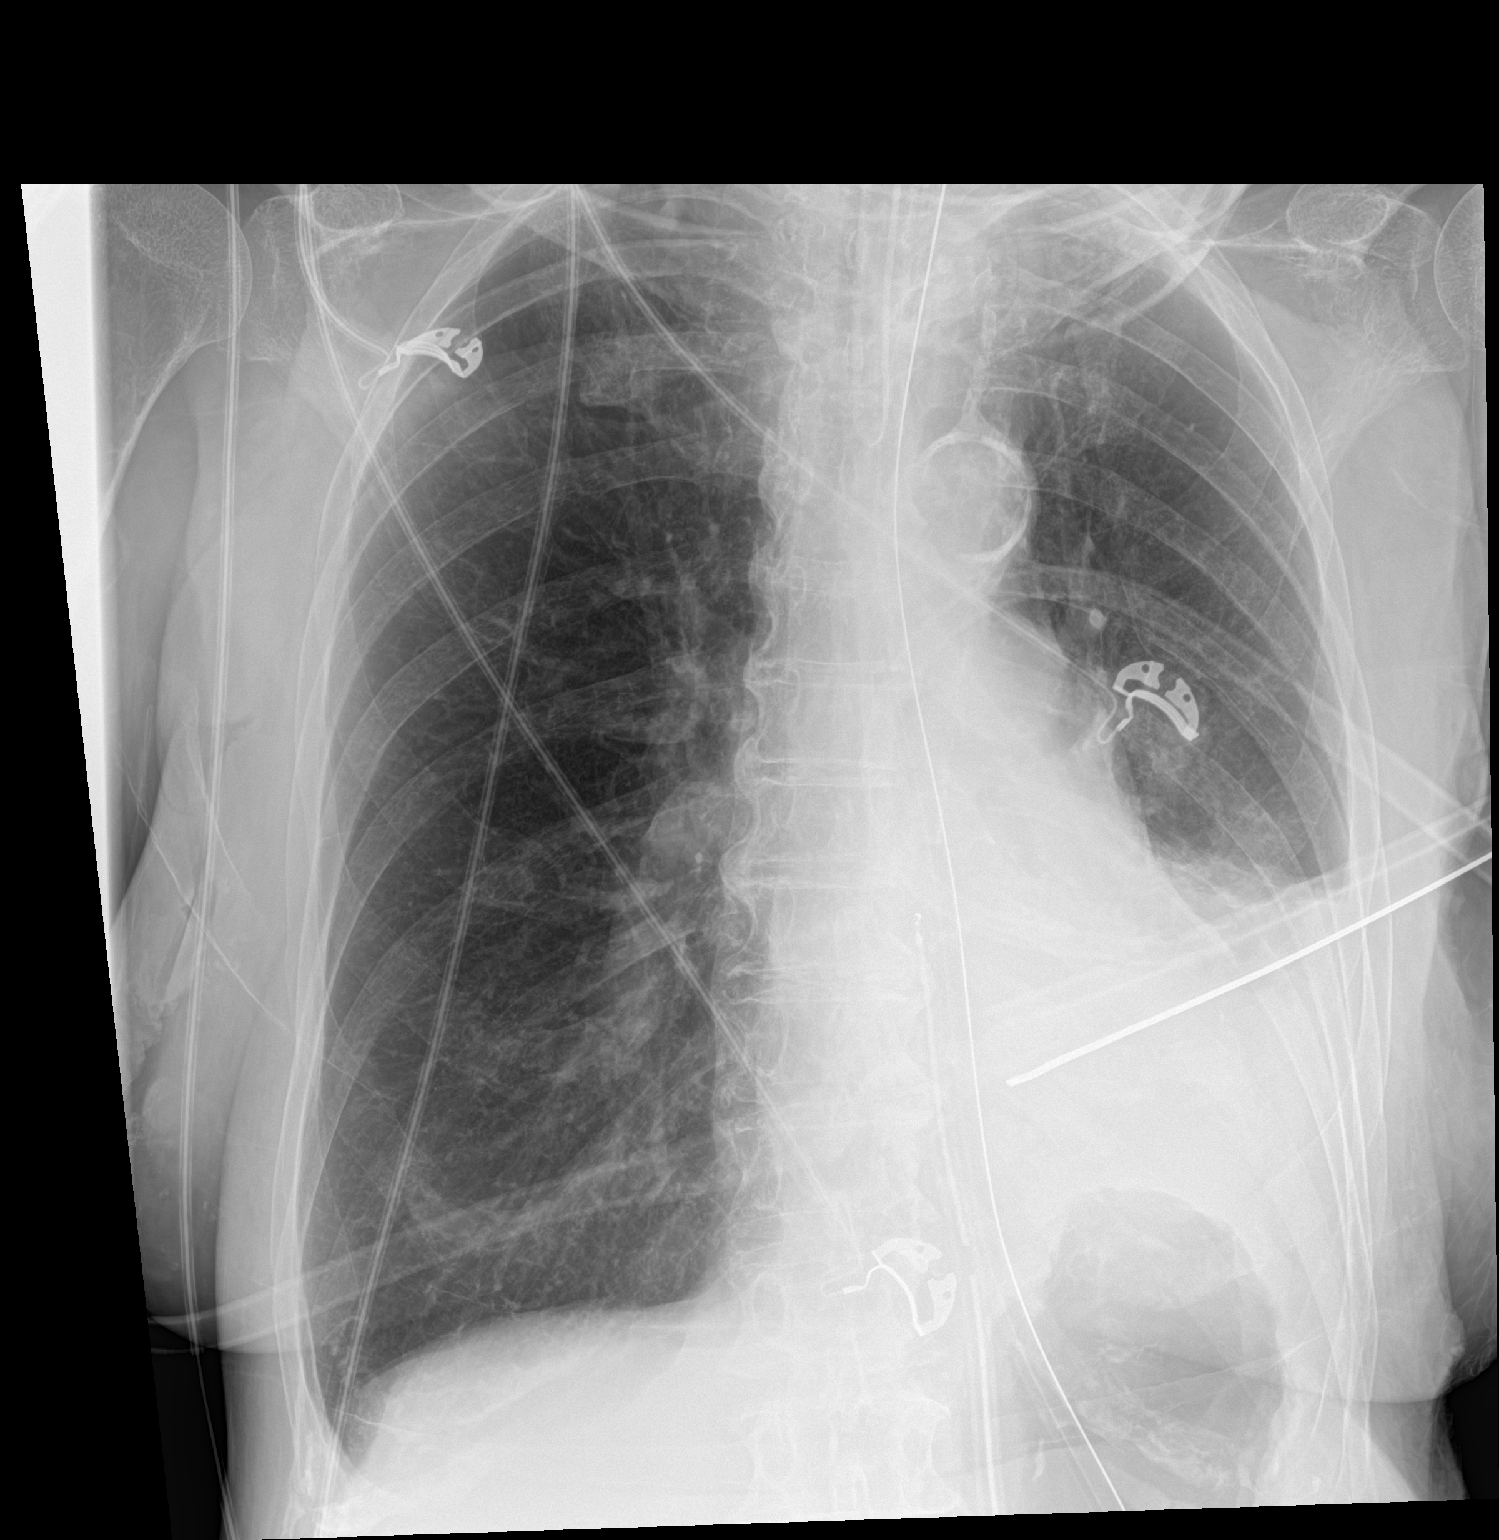

[1 of 1 positions shown; findings below may reference images not displayed]

FINDINGS: Endotracheal tube tip 5 cm above the carina. Orogastric tube appears
to be coiled in the stomach with its tip coiled back into the lower
esophagus, repositioning suggested. Atelectasis left lower lobe with
left-sided pleural effusion. No pneumothorax. Prior cervical spine
fusion. Thoracic spine degenerative change.
IMPRESSION: 1.  Endotracheal tube tip noted 5 cm above the carina.

2. Orogastric tube noted coiled in the stomach with its distal tip
coiled back into the lower esophagus. Repositioning suggested.

3.  Left lower lobe atelectasis with left-sided pleural effusion.

Critical Value/emergent results were called by telephone at the time
of interpretation on 03/17/2017 at [DATE] to nurse Tiger, who
verbally acknowledged these results.

## 2019-12-04 IMAGING — DX DG CHEST 1V PORT
1 series · 1 of 1 positions shown · non-contrast
Comparison: 03/19/2017

CLINICAL DATA: Respiratory failure

EXAM:
PORTABLE CHEST 1 VIEW

[chest]
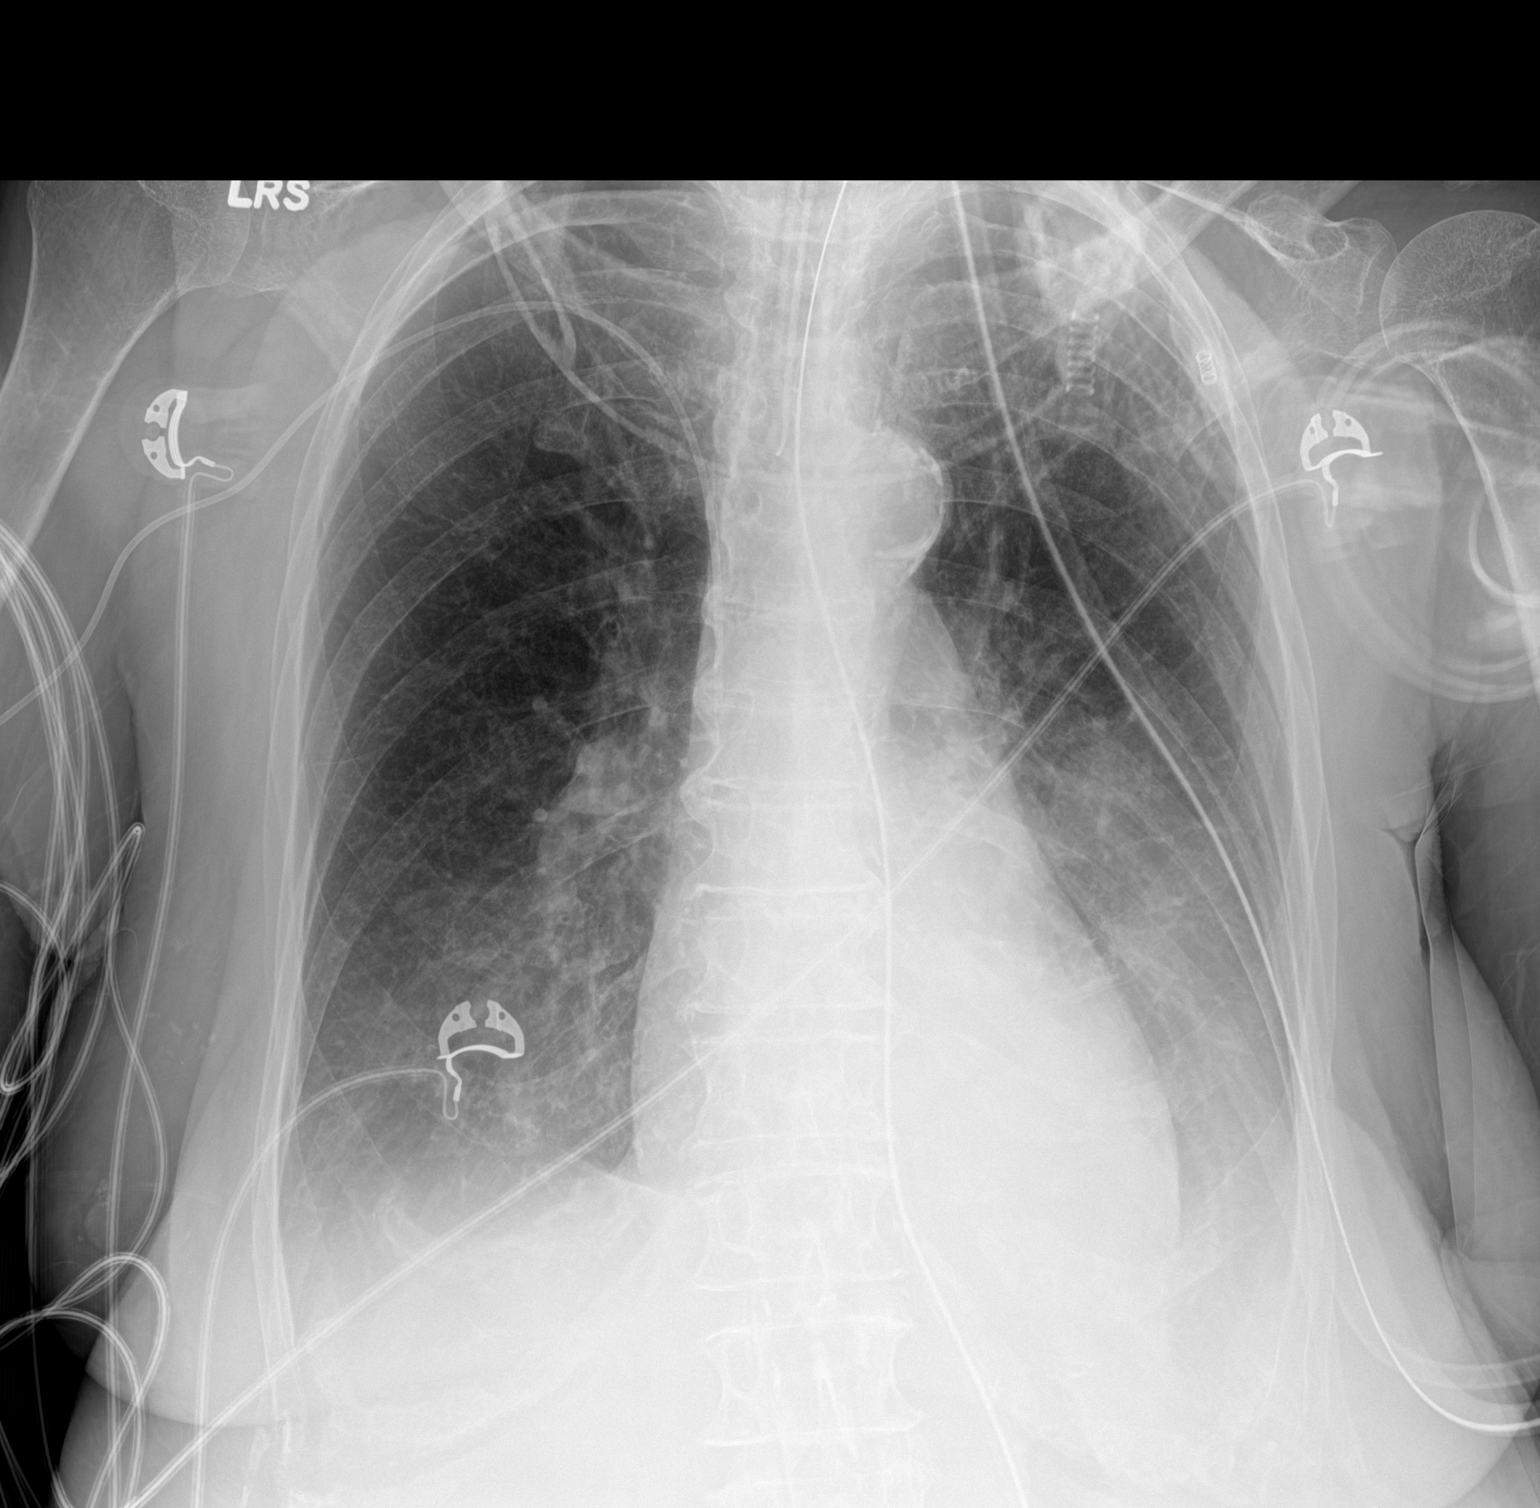

[1 of 1 positions shown; findings below may reference images not displayed]

FINDINGS: Endotracheal tube 6 cm from carina unchanged. Gastric 2 extends the
stomach. Lungs are hyperinflated. There bilateral pleural effusions
slightly increased. Upper lungs clear. RIGHT PICC line.
IMPRESSION: 1. Stable support apparatus.
2. Mild increase in bilateral pleural effusions.
3. Hyperinflated lungs.
